# Patient Record
Sex: Female | Born: 1985
Health system: Southern US, Community
[De-identification: ages and names within clinical notes are randomized; demographics above are authoritative.]

## PROBLEM LIST (undated history)

## (undated) DIAGNOSIS — G43909 Migraine, unspecified, not intractable, without status migrainosus: Secondary | ICD-10-CM

---

## 2009-12-20 ENCOUNTER — Emergency Department (HOSPITAL_BASED_OUTPATIENT_CLINIC_OR_DEPARTMENT_OTHER): Admission: EM | Admit: 2009-12-20 | Discharge: 2009-12-20 | Payer: Self-pay | Admitting: Emergency Medicine

## 2009-12-20 ENCOUNTER — Ambulatory Visit: Payer: Self-pay | Admitting: Diagnostic Radiology

## 2010-02-18 ENCOUNTER — Inpatient Hospital Stay (HOSPITAL_COMMUNITY): Admission: AD | Admit: 2010-02-18 | Discharge: 2010-02-18 | Payer: Self-pay | Admitting: Obstetrics & Gynecology

## 2010-07-04 ENCOUNTER — Inpatient Hospital Stay (HOSPITAL_COMMUNITY)
Admission: AD | Admit: 2010-07-04 | Discharge: 2010-07-06 | Payer: Self-pay | Source: Home / Self Care | Attending: Obstetrics and Gynecology | Admitting: Obstetrics and Gynecology

## 2010-08-30 ENCOUNTER — Inpatient Hospital Stay (HOSPITAL_COMMUNITY)
Admission: AD | Admit: 2010-08-30 | Discharge: 2010-09-03 | DRG: 371 | Disposition: A | Discharge status: Home / Self Care | Payer: BC Managed Care – PPO | Source: Ambulatory Visit | Attending: Obstetrics and Gynecology | Admitting: Obstetrics and Gynecology

## 2010-08-30 ENCOUNTER — Encounter (HOSPITAL_COMMUNITY): Payer: Self-pay | Admitting: *Deleted

## 2010-08-30 DIAGNOSIS — O480 Post-term pregnancy: Principal | ICD-10-CM | POA: Diagnosis present

## 2010-08-30 LAB — CBC
MCHC: 32.8 g/dL (ref 30.0–36.0)
Platelets: 206 10*3/uL (ref 150–400)
RBC: 4.17 MIL/uL (ref 3.87–5.11)
WBC: 7.2 10*3/uL (ref 4.0–10.5)

## 2010-09-02 LAB — CBC
MCHC: 31.5 g/dL (ref 30.0–36.0)
MCV: 90.7 fL (ref 78.0–100.0)
Platelets: 172 10*3/uL (ref 150–400)
RBC: 3.33 MIL/uL — ABNORMAL LOW (ref 3.87–5.11)
RDW: 20 % — ABNORMAL HIGH (ref 11.5–15.5)
WBC: 9.5 10*3/uL (ref 4.0–10.5)

## 2010-09-04 ENCOUNTER — Observation Stay (HOSPITAL_COMMUNITY)
Admission: AD | Admit: 2010-09-04 | Discharge: 2010-09-05 | Disposition: A | Discharge status: Home / Self Care | Payer: Medicaid Other | Source: Ambulatory Visit | Attending: Obstetrics and Gynecology | Admitting: Obstetrics and Gynecology

## 2010-09-04 DIAGNOSIS — O9279 Other disorders of lactation: Principal | ICD-10-CM | POA: Insufficient documentation

## 2010-09-04 DIAGNOSIS — O864 Pyrexia of unknown origin following delivery: Secondary | ICD-10-CM | POA: Insufficient documentation

## 2010-09-04 LAB — COMPREHENSIVE METABOLIC PANEL
ALT: 27 U/L (ref 0–35)
AST: 31 U/L (ref 0–37)
Albumin: 2.3 g/dL — ABNORMAL LOW (ref 3.5–5.2)
Alkaline Phosphatase: 146 U/L — ABNORMAL HIGH (ref 39–117)
Calcium: 8.1 mg/dL — ABNORMAL LOW (ref 8.4–10.5)
GFR calc Af Amer: >60 mL/min (ref >60)
GFR calc non Af Amer: >60 mL/min (ref >60)
Glucose, Bld: 98 mg/dL (ref 70–99)
Total Protein: 5.8 g/dL — ABNORMAL LOW (ref 6.0–8.3)

## 2010-09-04 LAB — CBC
HCT: 33.1 % — ABNORMAL LOW (ref 36.0–46.0)
Hemoglobin: 10.7 g/dL — ABNORMAL LOW (ref 12.0–15.0)
MCH: 29 pg (ref 26.0–34.0)
MCV: 89.7 fL (ref 78.0–100.0)
Platelets: 209 10*3/uL (ref 150–400)
RBC: 3.69 MIL/uL — ABNORMAL LOW (ref 3.87–5.11)
WBC: 9 10*3/uL (ref 4.0–10.5)

## 2010-09-04 LAB — URINALYSIS, ROUTINE W REFLEX MICROSCOPIC
Bilirubin Urine: NEGATIVE
Ketones, ur: NEGATIVE mg/dL
Specific Gravity, Urine: 1.02 (ref 1.005–1.030)
Urine Glucose, Fasting: NEGATIVE mg/dL

## 2010-09-04 LAB — URINE MICROSCOPIC-ADD ON

## 2010-09-05 LAB — CBC
Hemoglobin: 9.9 g/dL — ABNORMAL LOW (ref 12.0–15.0)
MCH: 28.8 pg (ref 26.0–34.0)
MCHC: 32 g/dL (ref 30.0–36.0)
MCV: 89.8 fL (ref 78.0–100.0)
Platelets: 207 10*3/uL (ref 150–400)
WBC: 6.6 10*3/uL (ref 4.0–10.5)

## 2010-09-05 LAB — DIFFERENTIAL
Basophils Absolute: 0 10*3/uL (ref 0.0–0.1)
Monocytes Relative: 8 % (ref 3–12)
Neutro Abs: 5 10*3/uL (ref 1.7–7.7)
Neutrophils Relative %: 76 % (ref 43–77)

## 2010-09-08 NOTE — Op Note (Signed)
Andrea Richard, Andrea Richard            ACCOUNT NO.:  [id number]  MEDICAL RECORD NO.:  [id number]           PATIENT TYPE:  I  LOCATION:  9131                          FACILITY:  [facility]  PHYSICIAN:  Pieter Partridge, MD   DATE OF BIRTH:  1986-02-22  DATE OF PROCEDURE:  09/01/2010 DATE OF DISCHARGE:                              OPERATIVE REPORT   PREOPERATIVE DIAGNOSES: 1. Pregnancy at 41-2/7th weeks. 2. Failed induction.  POSTOPERATIVE DIAGNOSES: 1. Pregnancy at 41-2/7th weeks. 2. Failed induction.  PROCEDURE:  Primary low transverse cesarean section.  SURGEON:  Pieter Partridge, MD  ASSISTANT:  Technician.  ANESTHESIA:  Spinal.  FINDINGS:  Viable female infant, Apgars 8 at 1, 9 at 5, 8 pounds and 11 ounces.  Normal uterus.  Bilateral tubes and ovaries normal.  SPECIMENS:  None.  Placenta sent to Labor and Delivery.  ESTIMATED BLOOD LOSS:  800.  IV FLUIDS:  2000 mL.  URINE OUTPUT:  100 mL, clear.  COMPLICATIONS:  None. DISPOSITION:  To PACU stable.  INDICATIONS:  Andrea Richard is a 25 year old gravida 1 at 41-2/7th weeks by an estimated due date of August 23, 2010.  She was admitted for cervical ripening on August 30, 2010.  She received 2 doses of Cytotec and began having contractions that were moderate.  Approximately 12 hours after admission, Pitocin was done for another 10-12 hours.  She was contracting regularly, even having tachysystole.  Her cervix on admission was fingertip, thick, and high.  Pitocin was continued for approximately 12 hours.  At the end of that, she was tight, 1, still thick and high.  Pitocin was stopped.  The patient was allowed to eat and shower.  Cervidil was in place.  She began to continue to have contractions that were moderate by palpation.  The patient stated that they were very uncomfortable.  She received 3 doses Stadol, however, despite the intensity of the contractions, cervical change was not noted.  At the time of my evaluation,  it was 1, 50, and -2 at approximately 36 hours.  Once the Cervidil was removed, contractions spaced out significantly.  The patient was counseled on a primary low transverse cesarean section, risks, benefits, and alternatives and also was counseled on VBAC and its risks.  She desired to proceed with primary C-section.  PROCEDURE IN DETAIL:  Andrea Richard was identified in the operating room. She had IV running.  She underwent spinal anesthesia without complication.  She was placed in the dorsal supine position with a leftward tilt and prepped and draped in normal sterile fashion.  Foley to gravity was placed.  The abdomen was marked and a Pfannenstiel skin incision was made 2 cm above the symphysis pubis with the scalpel and carried down to the underlying layer of the fascia with the Bovie.  The fascia was incised with the Bovie and the incision was extended laterally with the curved Mayo scissors.  The Kocher clamps were used to grasp the upper edge of the fascia and the rectus muscles were dissected sharply off the fascia. The same was done on the inferior edge.  The muscles were then separated at the  middle digitally.  The peritoneum was then tented up with hemostats and entered sharply with the Metzenbaum scissors.  Peritoneum was stretched.  Alexis retractor was then placed in the usual fashion.  Lower uterine segment was identified.  Serosa was tented up with the Guernsey forceps and entered sharply with the Metzenbaum scissors and a transverse incision was made on the lower uterine segment of the uterus and extended with the bandage scissors.  Rupture of membranes with clear, output was well, was right at the incision had not descended into the pelvis at all.  The head was brought to the incision and delivered atraumatically.  Nose and mouth were suctioned.  No nuchal cord noted. Shoulders delivered easily.  Cord clamped x2 and cut.  Baby placed at bedside to awaiting NICU  Team.  Placenta was then delivered manually.  Uterus was cleared of all clots and debris.  Ring forceps were used to remove additional membranes on the lower uterine segment.  Hysterotomy incision was then reapproximated with 0 chromic in a running locked fashion.  A second layer of the same suture was used for imbrication.  The bladder flap was then reapproximated with 3-0 Vicryl on an SH needle.  Irrigation was done posteriorly and the uterus was returned to the abdomen and the gutters were then cleared of all clots and debris by irrigation.  Of note during the procedure, there was a moist laparotomy sponge placed in the abdomen to retract bowel and omentum and that was removed prior to return of the uterus to the abdomen.  The peritoneum and muscles were then reapproximated with a series of interrupted sutures.  The fascia was then reapproximated with 0 Vicryl on a continuous needle.  The subcutaneous base was irrigated and hemostasis was achieved with the Bovie.  Prior to closure of the fascia, the rectus muscles were identified for any bleeding and also under the fascia and everything was normal.  The skin was then reapproximated with 4-0 Vicryl on a Keith needle. Dermabond was then applied to the incision.  All instrument, sponge, and needle counts were correct x3.  Ancef 1 g was given prior to incision. SCDs were also in place.  The patient was then taken to the recovery room in stable condition.     Pieter Partridge, MD     EBV/MEDQ  D:  09/01/2010  T:  09/01/2010  Job:  [id number]  Electronically Signed by Geryl Rankins MD on 09/08/2010 08:37:41 AM

## 2010-09-08 NOTE — H&P (Signed)
  NAMEDEBORRAH, MABIN            ACCOUNT NO.:  [id number]  MEDICAL RECORD NO.:  [id number]           PATIENT TYPE:  I  LOCATION:  9131                          FACILITY:  [facility]  PHYSICIAN:  Pieter Partridge, MD   DATE OF BIRTH:  1986-06-16  DATE OF ADMISSION:  08/30/2010 DATE OF DISCHARGE:                             HISTORY & PHYSICAL   ANTICIPATED PROCEDURE:  Primary low transverse cesarean section.  HISTORY OF PRESENT ILLNESS:  Ms. Kolander is a 25 year old gravida 1, at 41-1/7 weeks admitted for postdates induction.  Her pregnancy was complicated by gastroenteritis approximately 30 weeks which required hospital admission for significant dehydration.  She received IV fluids and tocolysis for contractions.  Cervix never dilated.  Fetal status was always reassuring.  The patient was allowed to go approximately 1 week past her due date and desired induction.  She was then counseled on risks.  She came in on the evening of August 30, 2010, and Cytotec was placed.  Following morning, Pitocin was started.  Her cervix upon arrival was approximately fingertip.  Pitocin continued approximately 18 hours and at that time still tight one and about 30% in high.  Pitocin was stopped.  Cervidil was then placed.  The patient continued to contract on that and painful contractions were noted without any cervical change.  Also, in the pregnancy at the time of the admission for dehydration, the patient has severe anemia with a hemoglobin of 7.  She was placed on iron therapy and her hemoglobin came to 11 prior to admission, at the time of admission it was 12.  She was GBS negative.  ALLERGIES:  No known drug allergies.  MEDICATIONS:  Prenatal vitamins and iron.  PAST MEDICAL HISTORY:  Negative.  PAST SURGICAL HISTORY:  Negative.  OBSTETRIC HISTORY:  She is a primigravida.  GYNECOLOGICAL HISTORY:  She did have Chlamydia.  No abnormal paps.  No history of PID.  FAMILY HISTORY:   Remarkable for diabetes in her maternal and paternal grandmothers, hypertension in her maternal grandmother and paternal grandmother, and also her mom has emphysema.  Father of the baby has sickle cell trait.  SOCIAL HISTORY:  Negative for tobacco, alcohol, and drug use.  She also has a negative titer for varicella.  PHYSICAL EXAMINATION:  VITAL SIGNS:  The patient was afebrile.  Vital signs were within normal limits on admission. GENERAL:  No acute distress, alert and oriented. ABDOMEN:  Gravid, vertex.  Estimated fetal weight is 9 pounds. PELVIS:  Moderately adequate, fetal station.  Cervical exam, again fingertip thick and high.  External monitoring, baby is reactive, irregular contractions prior to arrival with Cytotec, more frequent contractions.  LABORATORY STUDIES:  Hemoglobin was 12.2.  ASSESSMENT:  Ms. Tihanna Goodson is a [age] year old gravida 1, at 42 weeks, admitted for elective induction for postdates.  PLAN:  To use Cytotec and Pitocin.  The patient is counseled on C- section.     Pieter Partridge, MD     EBV/MEDQ  D:  09/01/2010  T:  09/01/2010  Job:  [id number]  Electronically Signed by Geryl Rankins MD on 09/08/2010 08:37:51 AM

## 2010-09-26 LAB — WET PREP, GENITAL
Trich, Wet Prep: NONE SEEN
Yeast Wet Prep HPF POC: NONE SEEN

## 2010-09-26 LAB — URINALYSIS, DIPSTICK ONLY
Bilirubin Urine: NEGATIVE
Bilirubin Urine: NEGATIVE
Glucose, UA: NEGATIVE mg/dL
Glucose, UA: NEGATIVE mg/dL
Hgb urine dipstick: NEGATIVE
Ketones, ur: NEGATIVE mg/dL
Leukocytes, UA: NEGATIVE
Nitrite: NEGATIVE
Nitrite: NEGATIVE
Nitrite: NEGATIVE
Protein, ur: NEGATIVE mg/dL
Specific Gravity, Urine: 1.01 (ref 1.005–1.030)
Specific Gravity, Urine: 1.02 (ref 1.005–1.030)
Specific Gravity, Urine: 1.025 (ref 1.005–1.030)
Urobilinogen, UA: 0.2 mg/dL (ref 0.0–1.0)
Urobilinogen, UA: 1 mg/dL (ref 0.0–1.0)
pH: 6 (ref 5.0–8.0)
pH: 6.5 (ref 5.0–8.0)
pH: 7.5 (ref 5.0–8.0)

## 2010-09-26 LAB — DIFFERENTIAL
Basophils Absolute: 0 10*3/uL (ref 0.0–0.1)
Basophils Absolute: 0 10*3/uL (ref 0.0–0.1)
Basophils Absolute: 0 10*3/uL (ref 0.0–0.1)
Basophils Relative: 0 % (ref 0–1)
Basophils Relative: 0 % (ref 0–1)
Basophils Relative: 0 % (ref 0–1)
Eosinophils Absolute: 0 10*3/uL (ref 0.0–0.7)
Eosinophils Absolute: 0 10*3/uL (ref 0.0–0.7)
Eosinophils Absolute: 0.1 10*3/uL (ref 0.0–0.7)
Eosinophils Relative: 0 % (ref 0–5)
Eosinophils Relative: 0 % (ref 0–5)
Eosinophils Relative: 1 % (ref 0–5)
Lymphocytes Relative: 15 % (ref 12–46)
Lymphocytes Relative: 5 % — ABNORMAL LOW (ref 12–46)
Lymphocytes Relative: 7 % — ABNORMAL LOW (ref 12–46)
Lymphs Abs: 0.5 10*3/uL — ABNORMAL LOW (ref 0.7–4.0)
Lymphs Abs: 0.6 10*3/uL — ABNORMAL LOW (ref 0.7–4.0)
Lymphs Abs: 0.9 10*3/uL (ref 0.7–4.0)
Monocytes Absolute: 0.5 10*3/uL (ref 0.1–1.0)
Monocytes Absolute: 0.7 10*3/uL (ref 0.1–1.0)
Monocytes Absolute: 0.7 10*3/uL (ref 0.1–1.0)
Monocytes Relative: 11 % (ref 3–12)
Monocytes Relative: 7 % (ref 3–12)
Monocytes Relative: 7 % (ref 3–12)
Neutro Abs: 4.5 10*3/uL (ref 1.7–7.7)
Neutro Abs: 6.2 10*3/uL (ref 1.7–7.7)
Neutro Abs: 8.9 10*3/uL — ABNORMAL HIGH (ref 1.7–7.7)
Neutrophils Relative %: 73 % (ref 43–77)
Neutrophils Relative %: 87 % — ABNORMAL HIGH (ref 43–77)
Neutrophils Relative %: 87 % — ABNORMAL HIGH (ref 43–77)

## 2010-09-26 LAB — CBC
HCT: 23.5 % — ABNORMAL LOW (ref 36.0–46.0)
HCT: 24.4 % — ABNORMAL LOW (ref 36.0–46.0)
HCT: 30.2 % — ABNORMAL LOW (ref 36.0–46.0)
Hemoglobin: 7.6 g/dL — ABNORMAL LOW (ref 12.0–15.0)
Hemoglobin: 7.8 g/dL — ABNORMAL LOW (ref 12.0–15.0)
Hemoglobin: 9.6 g/dL — ABNORMAL LOW (ref 12.0–15.0)
MCH: 26.4 pg (ref 26.0–34.0)
MCH: 26.6 pg (ref 26.0–34.0)
MCH: 27 pg (ref 26.0–34.0)
MCHC: 31.8 g/dL (ref 30.0–36.0)
MCHC: 32 g/dL (ref 30.0–36.0)
MCHC: 32.3 g/dL (ref 30.0–36.0)
MCV: 83.2 fL (ref 78.0–100.0)
MCV: 83.3 fL (ref 78.0–100.0)
MCV: 83.6 fL (ref 78.0–100.0)
Platelets: 188 10*3/uL (ref 150–400)
Platelets: 193 10*3/uL (ref 150–400)
Platelets: 220 10*3/uL (ref 150–400)
RBC: 2.81 MIL/uL — ABNORMAL LOW (ref 3.87–5.11)
RBC: 2.93 MIL/uL — ABNORMAL LOW (ref 3.87–5.11)
RBC: 3.63 MIL/uL — ABNORMAL LOW (ref 3.87–5.11)
RDW: 13.8 % (ref 11.5–15.5)
RDW: 14 % (ref 11.5–15.5)
RDW: 14.1 % (ref 11.5–15.5)
WBC: 10.4 10*3/uL (ref 4.0–10.5)
WBC: 6.1 10*3/uL (ref 4.0–10.5)
WBC: 7.1 10*3/uL (ref 4.0–10.5)

## 2010-09-26 LAB — COMPREHENSIVE METABOLIC PANEL
ALT: 13 U/L (ref 0–35)
AST: 17 U/L (ref 0–37)
Albumin: 2.7 g/dL — ABNORMAL LOW (ref 3.5–5.2)
Alkaline Phosphatase: 154 U/L — ABNORMAL HIGH (ref 39–117)
BUN: 4 mg/dL — ABNORMAL LOW (ref 6–23)
CO2: 21 mEq/L (ref 19–32)
Calcium: 8.7 mg/dL (ref 8.4–10.5)
Chloride: 103 mEq/L (ref 96–112)
Creatinine, Ser: 0.54 mg/dL (ref 0.4–1.2)
GFR calc Af Amer: >60 mL/min (ref >60)
GFR calc non Af Amer: >60 mL/min (ref >60)
Glucose, Bld: 81 mg/dL (ref 70–99)
Potassium: 3.5 mEq/L (ref 3.5–5.1)
Sodium: 134 mEq/L — ABNORMAL LOW (ref 135–145)
Total Bilirubin: 0.6 mg/dL (ref 0.3–1.2)
Total Protein: 6.8 g/dL (ref 6.0–8.3)

## 2010-09-26 LAB — URINE MICROSCOPIC-ADD ON

## 2010-09-26 LAB — URINALYSIS, ROUTINE W REFLEX MICROSCOPIC
Bilirubin Urine: NEGATIVE
Glucose, UA: NEGATIVE mg/dL
Hgb urine dipstick: NEGATIVE
Protein, ur: NEGATIVE mg/dL
Specific Gravity, Urine: 1.025 (ref 1.005–1.030)

## 2010-09-26 LAB — KETONES, QUALITATIVE: Acetone, Bld: NEGATIVE

## 2010-09-26 LAB — AMYLASE: Amylase: 72 U/L (ref 0–105)

## 2010-09-26 LAB — LIPASE, BLOOD: Lipase: 37 U/L (ref 11–59)

## 2010-09-30 LAB — URINALYSIS, ROUTINE W REFLEX MICROSCOPIC
Bilirubin Urine: NEGATIVE
Glucose, UA: NEGATIVE mg/dL
Ketones, ur: NEGATIVE mg/dL
Leukocytes, UA: NEGATIVE
Nitrite: NEGATIVE
Protein, ur: NEGATIVE mg/dL
Specific Gravity, Urine: 1.02 (ref 1.005–1.030)
Urobilinogen, UA: 0.2 mg/dL (ref 0.0–1.0)
pH: 6.5 (ref 5.0–8.0)

## 2010-09-30 LAB — URINE MICROSCOPIC-ADD ON

## 2010-10-03 LAB — GC/CHLAMYDIA PROBE AMP, GENITAL: Chlamydia, DNA Probe: NEGATIVE

## 2010-10-03 LAB — BASIC METABOLIC PANEL
BUN: 7 mg/dL (ref 6–23)
Calcium: 9.2 mg/dL (ref 8.4–10.5)
Creatinine, Ser: 0.7 mg/dL (ref 0.4–1.2)
GFR calc non Af Amer: >60 mL/min (ref >60)
Glucose, Bld: 107 mg/dL — ABNORMAL HIGH (ref 70–99)
Potassium: 3.6 mEq/L (ref 3.5–5.1)

## 2010-10-03 LAB — URINALYSIS, ROUTINE W REFLEX MICROSCOPIC
Bilirubin Urine: NEGATIVE
Glucose, UA: NEGATIVE mg/dL
Ketones, ur: 15 mg/dL — AB
Protein, ur: NEGATIVE mg/dL

## 2010-10-03 LAB — CBC
Platelets: 268 10*3/uL (ref 150–400)
RDW: 12.4 % (ref 11.5–15.5)
WBC: 5.8 10*3/uL (ref 4.0–10.5)

## 2010-10-03 LAB — URINE CULTURE: Colony Count: 75000

## 2010-10-03 LAB — DIFFERENTIAL
Basophils Absolute: 0.1 10*3/uL (ref 0.0–0.1)
Lymphocytes Relative: 24 % (ref 12–46)
Lymphs Abs: 1.4 10*3/uL (ref 0.7–4.0)
Neutrophils Relative %: 66 % (ref 43–77)

## 2010-10-03 LAB — ABO/RH: ABO/RH(D): B POS

## 2010-10-03 LAB — WET PREP, GENITAL

## 2010-10-03 LAB — RPR: RPR Ser Ql: NONREACTIVE

## 2010-10-03 LAB — HCG, QUANTITATIVE, PREGNANCY: hCG, Beta Chain, Quant, S: 16844 m[IU]/mL — ABNORMAL HIGH (ref <5)

## 2011-05-05 ENCOUNTER — Other Ambulatory Visit (HOSPITAL_COMMUNITY)
Admission: RE | Admit: 2011-05-05 | Discharge: 2011-05-05 | Disposition: A | Discharge status: Home / Self Care | Payer: Medicaid Other | Source: Ambulatory Visit | Attending: Obstetrics and Gynecology | Admitting: Obstetrics and Gynecology

## 2011-05-05 DIAGNOSIS — N760 Acute vaginitis: Secondary | ICD-10-CM | POA: Insufficient documentation

## 2011-05-05 DIAGNOSIS — Z01419 Encounter for gynecological examination (general) (routine) without abnormal findings: Secondary | ICD-10-CM | POA: Insufficient documentation

## 2011-05-05 DIAGNOSIS — Z113 Encounter for screening for infections with a predominantly sexual mode of transmission: Secondary | ICD-10-CM | POA: Insufficient documentation

## 2011-10-14 ENCOUNTER — Emergency Department (HOSPITAL_BASED_OUTPATIENT_CLINIC_OR_DEPARTMENT_OTHER)
Admission: EM | Admit: 2011-10-14 | Discharge: 2011-10-14 | Disposition: A | Discharge status: Home / Self Care | Payer: Medicaid Other | Attending: Emergency Medicine | Admitting: Emergency Medicine

## 2011-10-14 ENCOUNTER — Encounter (HOSPITAL_BASED_OUTPATIENT_CLINIC_OR_DEPARTMENT_OTHER): Payer: Self-pay | Admitting: *Deleted

## 2011-10-14 DIAGNOSIS — H53149 Visual discomfort, unspecified: Secondary | ICD-10-CM | POA: Insufficient documentation

## 2011-10-14 DIAGNOSIS — R112 Nausea with vomiting, unspecified: Secondary | ICD-10-CM | POA: Insufficient documentation

## 2011-10-14 DIAGNOSIS — R51 Headache: Secondary | ICD-10-CM | POA: Insufficient documentation

## 2011-10-14 HISTORY — DX: Migraine, unspecified, not intractable, without status migrainosus: G43.909

## 2011-10-14 MED ORDER — SODIUM CHLORIDE 0.9 % IV BOLUS (SEPSIS): 1000.0000 mL | Freq: Once | INTRAVENOUS | Status: DC

## 2011-10-14 MED ORDER — DIPHENHYDRAMINE HCL 50 MG/ML IJ SOLN
25.0000 mg | Freq: Once | INTRAMUSCULAR | Status: AC
  Administered 2011-10-14: 25 mg via INTRAVENOUS
  Filled 2011-10-14: qty 1

## 2011-10-14 MED ORDER — KETOROLAC TROMETHAMINE 30 MG/ML IJ SOLN
30.0000 mg | Freq: Once | INTRAMUSCULAR | Status: DC
  Filled 2011-10-14: qty 1

## 2011-10-14 MED ORDER — KETOROLAC TROMETHAMINE 60 MG/2ML IM SOLN
INTRAMUSCULAR | Status: AC
  Filled 2011-10-14: qty 2

## 2011-10-14 MED ORDER — METOCLOPRAMIDE HCL 5 MG/ML IJ SOLN
10.0000 mg | Freq: Once | INTRAMUSCULAR | Status: AC
  Administered 2011-10-14: 10 mg via INTRAVENOUS
  Filled 2011-10-14: qty 2

## 2011-10-14 MED ORDER — KETOROLAC TROMETHAMINE 60 MG/2ML IM SOLN
60.0000 mg | Freq: Once | INTRAMUSCULAR | Status: AC
  Administered 2011-10-14: 60 mg via INTRAMUSCULAR

## 2011-10-14 MED ORDER — ONDANSETRON 4 MG PO TBDP: 4.0000 mg | ORAL_TABLET | Freq: Three times a day (TID) | ORAL | Status: AC | PRN

## 2011-10-14 NOTE — ED Provider Notes (Signed)
History     CSN: [id number]  Arrival date & time 10/14/11  1620   First MD Initiated Contact with Patient 10/14/11 1708      Chief Complaint  Patient presents with  . Migraine    (Consider location/radiation/quality/duration/timing/severity/associated sxs/prior treatment) HPI Comments: Pt states that she has a history of migraines and it is usually controlled with tylenol:pt states that is having light sensitivity and vomiting  Patient is a 26 y.o. female presenting with migraine. The history is provided by the patient. No language interpreter was used.  Migraine This is a recurrent problem. The current episode started today. The problem occurs constantly. The problem has been unchanged. Associated symptoms include headaches, nausea and vomiting. Pertinent negatives include no fever or rash. Exacerbated by: light. She has tried acetaminophen for the symptoms. The treatment provided no relief.    Past Medical History  Diagnosis Date  . Migraines     Past Surgical History  Procedure Date  . Cesarean section     History reviewed. No pertinent family history.  History  Substance Use Topics  . Smoking status: Never Smoker   . Smokeless tobacco: Not on file  . Alcohol Use: No    OB History    Grav Para Term Preterm Abortions TAB SAB Ect Mult Living   1               Review of Systems  Constitutional: Negative for fever.  Eyes: Positive for photophobia.  Respiratory: Negative.   Cardiovascular: Negative.   Gastrointestinal: Positive for nausea and vomiting.  Musculoskeletal: Negative.   Skin: Negative for rash.  Neurological: Positive for headaches.    Allergies  Review of patient's allergies indicates no known allergies.  Home Medications   Current Outpatient Rx  Name Route Sig Dispense Refill  . ASPIRIN-ACETAMINOPHEN-CAFFEINE 250-250-65 MG PO TABS Oral Take 1 tablet by mouth every 6 (six) hours as needed. For pain      BP 117/95  Pulse 104  Temp(Src)  97.9 F (36.6 C) (Oral)  Resp 22  Ht 5\' 2"  (1.575 m)  Wt 166 lb (75.297 kg)  BMI 30.36 kg/m2  SpO2 98%  LMP 10/12/2011  Breastfeeding? Unknown  Physical Exam  Nursing note and vitals reviewed. Constitutional: She is oriented to person, place, and time. She appears well-developed and well-nourished.  HENT:  Head: Atraumatic.  Right Ear: External ear normal.  Left Ear: External ear normal.  Nose: Nose normal.  Mouth/Throat: Oropharynx is clear and moist.  Eyes: Conjunctivae and EOM are normal. Pupils are equal, round, and reactive to light.  Neck: Normal range of motion. Neck supple.  Cardiovascular: Normal rate and regular rhythm.   Pulmonary/Chest: Effort normal and breath sounds normal.  Musculoskeletal: Normal range of motion.  Neurological: She is alert and oriented to person, place, and time. She exhibits normal muscle tone. Coordination normal.  Skin: Skin is warm and dry.  Psychiatric: She has a normal mood and affect.    ED Course  Procedures (including critical care time)   Labs Reviewed  PREGNANCY, URINE   No results found.   1. Headache       MDM  Pt feeling better at this time after migraine cocktail:pt is tolerating po and is ready to go home:headache similar to previous headaches        Teressa Lower, NP 10/14/11 1926

## 2011-10-14 NOTE — ED Notes (Signed)
The patient is undressed and in a gown. The bed is locked and in the lowest position. The call light is within reach. Her family is in the room with her. The light in the room is out do to the migraine she is having, and that her eyes are hurting.

## 2011-10-14 NOTE — ED Notes (Signed)
Pt states she has a hx of migraines and can normally control the pain with Tylenol. Today has had vomiting and visual disturbances. No relief with Tylenol. PERL.

## 2011-10-14 NOTE — Discharge Instructions (Signed)

## 2011-10-15 NOTE — ED Provider Notes (Signed)
Medical screening examination/treatment/procedure(s) were performed by non-physician practitioner and as supervising physician I was immediately available for consultation/collaboration.  Doug Sou, MD 10/15/11 [phone]

## 2011-11-23 ENCOUNTER — Other Ambulatory Visit (HOSPITAL_COMMUNITY)
Admission: RE | Admit: 2011-11-23 | Discharge: 2011-11-23 | Disposition: A | Discharge status: Home / Self Care | Payer: BC Managed Care – PPO | Source: Ambulatory Visit | Attending: Obstetrics and Gynecology | Admitting: Obstetrics and Gynecology

## 2011-11-23 DIAGNOSIS — Z01419 Encounter for gynecological examination (general) (routine) without abnormal findings: Secondary | ICD-10-CM | POA: Insufficient documentation

## 2011-11-23 DIAGNOSIS — N760 Acute vaginitis: Secondary | ICD-10-CM | POA: Insufficient documentation

## 2012-03-13 ENCOUNTER — Encounter (HOSPITAL_BASED_OUTPATIENT_CLINIC_OR_DEPARTMENT_OTHER): Payer: Self-pay | Admitting: *Deleted

## 2012-03-13 ENCOUNTER — Emergency Department (HOSPITAL_BASED_OUTPATIENT_CLINIC_OR_DEPARTMENT_OTHER)
Admission: EM | Admit: 2012-03-13 | Discharge: 2012-03-13 | Disposition: A | Discharge status: Home / Self Care | Payer: BC Managed Care – PPO | Attending: Emergency Medicine | Admitting: Emergency Medicine

## 2012-03-13 DIAGNOSIS — L980 Pyogenic granuloma: Secondary | ICD-10-CM | POA: Insufficient documentation

## 2012-03-13 MED ORDER — LIDOCAINE-EPINEPHRINE 2 %-1:100000 IJ SOLN
INTRAMUSCULAR | Status: AC
  Administered 2012-03-13: 23:00:00
  Filled 2012-03-13: qty 1

## 2012-03-13 MED ORDER — BACITRACIN 500 UNIT/GM EX OINT
1.0000 "application " | TOPICAL_OINTMENT | Freq: Two times a day (BID) | CUTANEOUS | Status: DC
  Administered 2012-03-13: 1 via TOPICAL
  Filled 2012-03-13: qty 0.9

## 2012-03-13 NOTE — ED Provider Notes (Signed)
History     CSN: [id number]  Arrival date & time 03/13/12  2058   First MD Initiated Contact with Patient 03/13/12 2303      Chief Complaint  Patient presents with  . Abscess    (Consider location/radiation/quality/duration/timing/severity/associated sxs/prior treatment) HPI This is a 26 year old black female with about a one-week history of a lesion on the left medial thigh just distal to the groin fold. The lesion is mildly to moderately tender. It is pink and friable. It has not been draining pus. There was no known trauma that triggered it. She has no history of a similar lesion.  Past Medical History  Diagnosis Date  . Migraines     Past Surgical History  Procedure Date  . Cesarean section     History reviewed. No pertinent family history.  History  Substance Use Topics  . Smoking status: Never Smoker   . Smokeless tobacco: Not on file  . Alcohol Use: No    OB History    Grav Para Term Preterm Abortions TAB SAB Ect Mult Living   1               Review of Systems  All other systems reviewed and are negative.    Allergies  Review of patient's allergies indicates no known allergies.  Home Medications  No current outpatient prescriptions on file.  BP 128/70  Pulse 99  Temp 98.7 F (37.1 C) (Oral)  Resp 16  Ht 5\' 1"  (1.549 m)  Wt 168 lb (76.204 kg)  BMI 31.74 kg/m2  SpO2 100%  Breastfeeding? Unknown  Physical Exam General: Well-developed, well-nourished female in no acute distress; appearance consistent with age of record HENT: normocephalic, atraumatic Eyes: Normal appearance Neck: supple Heart: regular rate and rhythm Lungs: Normal respiratory effort and excursion Abdomen: soft; nondistended Extremities: No deformity; full range of motion Neurologic: Awake, alert and oriented; motor function intact in all extremities and symmetric; no facial droop Skin: Warm and dry; papular lesion left medial proximal thigh, lesion is pink and friable,  about 0.5 cm in diameter Psychiatric: Normal mood and affect    ED Course  Procedures (including critical care time)  Excision  Performed by: Ananya Mccleese L Authorized by: Hanley Seamen Consent: Verbal consent obtained. Risks and benefits: risks, benefits and alternatives were discussed Consent given by: patient Patient identity confirmed: provided demographic data Prepped and Draped in normal sterile fashion  Lesion Location: Left medial proximal thigh  A 1.5 cm elliptical excision was made to remove the lesion. The lesion was immediately sent to the lab and placed in formalin. It was marked with the patient's identification.  Anesthesia: local infiltration  Local anesthetic: lidocaine 2% with epinephrine  Anesthetic total: 3 ml  Skin closure: 5-0 Prolene   Number of sutures: 3   Technique: Simple interrupted   Patient tolerance: Patient tolerated the procedure well with no immediate complications.    MDM          Hanley Seamen, MD 03/13/12 [phone]

## 2012-03-13 NOTE — ED Notes (Signed)
C/o boil to left inner thigh x 2 weeks with drainage

## 2013-01-31 ENCOUNTER — Other Ambulatory Visit: Payer: Self-pay | Admitting: Obstetrics and Gynecology

## 2013-01-31 ENCOUNTER — Other Ambulatory Visit (HOSPITAL_COMMUNITY)
Admission: RE | Admit: 2013-01-31 | Discharge: 2013-01-31 | Disposition: A | Discharge status: Home / Self Care | Payer: 59 | Source: Ambulatory Visit | Attending: Obstetrics and Gynecology | Admitting: Obstetrics and Gynecology

## 2013-01-31 DIAGNOSIS — Z01419 Encounter for gynecological examination (general) (routine) without abnormal findings: Secondary | ICD-10-CM | POA: Insufficient documentation

## 2014-02-13 DIAGNOSIS — M79609 Pain in unspecified limb: Secondary | ICD-10-CM

## 2014-05-18 ENCOUNTER — Encounter (HOSPITAL_BASED_OUTPATIENT_CLINIC_OR_DEPARTMENT_OTHER): Payer: Self-pay | Admitting: *Deleted

## 2014-07-16 ENCOUNTER — Encounter: Payer: Self-pay | Admitting: Podiatry

## 2014-10-23 DIAGNOSIS — R52 Pain, unspecified: Secondary | ICD-10-CM

## 2014-11-11 DIAGNOSIS — R52 Pain, unspecified: Secondary | ICD-10-CM

## 2014-11-17 ENCOUNTER — Ambulatory Visit (INDEPENDENT_AMBULATORY_CARE_PROVIDER_SITE_OTHER): Payer: 59

## 2014-11-17 ENCOUNTER — Ambulatory Visit (INDEPENDENT_AMBULATORY_CARE_PROVIDER_SITE_OTHER): Payer: 59 | Admitting: Podiatry

## 2014-11-17 VITALS — BP 102/74 | HR 64 | Resp 16

## 2014-11-17 DIAGNOSIS — M779 Enthesopathy, unspecified: Secondary | ICD-10-CM

## 2014-11-17 MED ORDER — MELOXICAM 7.5 MG PO TABS: 7.5000 mg | ORAL_TABLET | Freq: Every day | ORAL | Status: DC

## 2014-11-17 NOTE — Patient Instructions (Signed)
Peroneal Tendinitis with Rehab Tendonitis is inflammation of a tendon. Inflammation of the tendons on the back of the outer ankle (peroneal tendons) is known as peroneal tendonitis. The peroneal tendons are responsible for connecting the muscles that allow you to stand on your tiptoes to the bones of the ankle. For this reason, peroneal tendonitis often causes pain when trying to complete such motions. Peroneal tendonitis often involves a tear (strain) of the peroneal tendons. Strains are classified into three categories. Grade 1 strains cause pain, but the tendon is not lengthened. Grade 2 strains include a lengthened ligament, due to the ligament being stretched or partially ruptured. With grade 2 strains there is still function, although function may be decreased. Grade 3 strains involve a complete tear of the tendon or muscle, and function is usually impaired. SYMPTOMS   Pain, tenderness, swelling, warmth, or redness over the back of the outer side of the ankle, the outer part of the mid-foot, or the bottom of the arch.  Pain that gets worse with ankle motion (especially when pushing off or pushing down with the front of the foot), or when standing on the ball of the foot or pushing the foot outward.  Crackling sound (crepitation) when the tendon is moved or touched. CAUSES  Peroneal tendinitis occurs when injury to the peroneal tendons causes the body to respond with inflammation. Common causes of injury include:  An overuse injury, in which the groove behind the outer ankle (where the tendon is located) causes wear on the tendon.  A sudden stress placed on the tendon, such as from an increase in the intensity, frequency, or duration of training.  Direct hit (trauma) to the tendon.  Return to activity too soon after a previous ankle injury. RISK INCREASES WITH:  Sports that require sudden, repetitive pushing off of the foot, such as jumping or quick starts.  Kicking and running sports,  especially running down hills or long distances.  Poor strength and flexibility.  Previous injury to the foot, ankle, or leg. PREVENTION  Warm up and stretch properly before activity.  Allow for adequate recovery between workouts.  Maintain physical fitness:  Strength, flexibility, and endurance.  Cardiovascular fitness.  Complete rehabilitation after previous injury. PROGNOSIS  If treated properly, peroneal tendonitis usually heals within 6 weeks.  RELATED COMPLICATIONS  Longer healing time, if not properly treated or if not given enough time to heal.  Recurring symptoms if activity is resumed too soon, with overuse, or when using poor technique.  If untreated, tendinitis may result in tendon rupture, requiring surgery. TREATMENT  Treatment first involves the use of ice and medicine to reduce pain and inflammation. The use of strengthening and stretching exercises may help reduce pain with activity. These exercises may be performed at home or with a therapist. Sometimes, the foot and ankle will be restrained for 10 to 14 days to promote healing. Your caregiver may advise that you place a heel lift in your shoes to reduce the stress placed on the tendon. If nonsurgical treatment is unsuccessful, surgery to remove the inflamed tendon lining (sheath) may be advised.  MEDICATION   If pain medicine is needed, nonsteroidal anti-inflammatory medicines (aspirin and ibuprofen), or other minor pain relievers (acetaminophen), are often advised.  Do not take pain medicine for 7 days before surgery.  Prescription pain relievers may be given, if your caregiver thinks they are needed. Use only as directed and only as much as you need. HEAT AND COLD  Cold treatment (icing) should   be applied for 10 to 15 minutes every 2 to 3 hours for inflammation and pain, and immediately after activity that aggravates your symptoms. Use ice packs or an ice massage.  Heat treatment may be used before  performing stretching and strengthening activities prescribed by your caregiver, physical therapist, or athletic trainer. Use a heat pack or a warm water soak. SEEK MEDICAL CARE IF:  Symptoms get worse or do not improve in 2 to 4 weeks, despite treatment.  New, unexplained symptoms develop. (Drugs used in treatment may produce side effects.) EXERCISES RANGE OF MOTION (ROM) AND STRETCHING EXERCISES - Peroneal Tendinitis These exercises may help you when beginning to rehabilitate your injury. Your symptoms may resolve with or without further involvement from your physician, physical therapist or athletic trainer. While completing these exercises, remember:   Restoring tissue flexibility helps normal motion to return to the joints. This allows healthier, less painful movement and activity.  An effective stretch should be held for at least 30 seconds.  A stretch should never be painful. You should only feel a gentle lengthening or release in the stretched tissue. RANGE OF MOTION - Ankle Eversion  Sit with your right / left ankle crossed over your opposite knee.  Grip your foot with your opposite hand, placing your thumb on the top of your foot and your fingers across the bottom of your foot.  Gently push your foot downward with a slight rotation, so your littlest toes rise slightly toward the ceiling.  You should feel a gentle stretch on the inside of your ankle. Hold the stretch for __________ seconds. Repeat __________ times. Complete this exercise __________ times per day.  RANGE OF MOTION - Ankle Inversion  Sit with your right / left ankle crossed over your opposite knee.  Grip your foot with your opposite hand, placing your thumb on the bottom of your foot and your fingers across the top of your foot.  Gently pull your foot so the smallest toe comes toward you and your thumb pushes the inside of the ball of your foot away from you.  You should feel a gentle stretch on the outside of  your ankle. Hold the stretch for __________ seconds. Repeat __________ times. Complete this exercise __________ times per day.  RANGE OF MOTION - Ankle Plantar Flexion  Sit with your right / left leg crossed over your opposite knee.  Use your opposite hand to pull the top of your foot and toes toward you.  You should feel a gentle stretch on the top of your foot and ankle. Hold this position for __________ seconds. Repeat __________ times. Complete __________ times per day.  STRETCH - Gastroc, Standing  Place your hands on a wall.  Extend your right / left leg behind you, keeping the front knee somewhat bent.  Slightly point your toes inward on your back foot.  Keeping your right / left heel on the floor and your knee straight, shift your weight toward the wall, not allowing your back to arch.  You should feel a gentle stretch in the calf. Hold this position for __________ seconds. Repeat __________ times. Complete this stretch __________ times per day. STRETCH - Soleus, Standing  Place your hands on a wall.  Extend your right / left leg behind you, keeping the other knee somewhat bent.  Slightly point your toes inward on your back foot.  Keep your heel on the floor, bend your back knee, and slightly shift your weight over the back leg so that   you feel a gentle stretch deep in your back calf.  Hold this position for __________ seconds. Repeat __________ times. Complete this stretch __________ times per day. STRETCH - Gastrocsoleus, Standing Note: This exercise can place a lot of stress on your foot and ankle. Please complete this exercise only if specifically instructed by your caregiver.   Place the ball of your right / left foot on a step, keeping your other foot firmly on the same step.  Hold on to the wall or a rail for balance.  Slowly lift your other foot, allowing your body weight to press your heel down over the edge of the step.  You should feel a stretch in your  right / left calf.  Hold this position for __________ seconds.  Repeat this exercise with a slight bend in your knee. Repeat __________ times. Complete this stretch __________ times per day.  STRENGTHENING EXERCISES - Peroneal Tendinitis  These exercises may help you when beginning to rehabilitate your injury. They may resolve your symptoms with or without further involvement from your physician, physical therapist or athletic trainer. While completing these exercises, remember:   Muscles can gain both the endurance and the strength needed for everyday activities through controlled exercises.  Complete these exercises as instructed by your physician, physical therapist or athletic trainer. Increase the resistance and repetitions only as guided by your caregiver. STRENGTH - Dorsiflexors  Secure a rubber exercise band or tubing to a fixed object (table, pole) and loop the other end around your right / left foot.  Sit on the floor facing the fixed object. The band should be slightly tense when your foot is relaxed.  Slowly draw your foot back toward you, using your ankle and toes.  Hold this position for __________ seconds. Slowly release the tension in the band and return your foot to the starting position. Repeat __________ times. Complete this exercise __________ times per day.  STRENGTH - Towel Curls  Sit in a chair, on a non-carpeted surface.  Place your foot on a towel, keeping your heel on the floor.  Pull the towel toward your heel only by curling your toes. Keep your heel on the floor.  If instructed by your physician, physical therapist or athletic trainer, add weight to the end of the towel. Repeat __________ times. Complete this exercise __________ times per day. STRENGTH - Ankle Eversion   Secure one end of a rubber exercise band or tubing to a fixed object (table, pole). Loop the other end around your foot, just before your toes.  Place your fists between your knees.  This will focus your strengthening at your ankle.  Drawing the band across your opposite foot, away from the pole, slowly, pull your little toe out and up. Make sure the band is positioned to resist the entire motion.  Hold this position for __________ seconds.  Have your muscles resist the band, as it slowly pulls your foot back to the starting position. Repeat __________ times. Complete this exercise __________ times per day.  Document Released: 07/03/2005 Document Revised: 11/17/2013 Document Reviewed: 10/15/2008 [employer] Patient Information 2015 [employer]. This information is not intended to replace advice given to you by your health care provider. Make sure you discuss any questions you have with your health care provider.  

## 2014-11-17 NOTE — Progress Notes (Signed)
Subjective:     Patient ID: Andrea Richard, female   DOB: May 20, 1986, 29 y.o.   MRN: [id number]  HPI Kelton PillarCharity presents to the office today with complaints of right ankle pain which has been ongoing for the last several months. She states the that the pain started when she started to walk more for exercise. She denies any history of injury or trauma. Denies any numbness or tingling. Denies any swelling or redness over the area. She has had no prior treatment. No other complaints at this time.   Review of Systems  All other systems reviewed and are negative.      Objective:   Physical Exam AAO x3, NAD DP/PT pulses palpable bilaterally, CRT less than 3 seconds Protective sensation intact with Simms Weinstein monofilament, vibratory sensation intact, Achilles tendon reflex intact Currently there is no areas of tenderness to palpation along the course of the peroneal tendons however subjectively I palpate over the plantar tendons posterior it in [place] lateral malleolus this is where she states that she has majority for pain. There is no pinpoint bony tenderness or pain the vibratory sensation to the tibia, fibula, areas of the foot. There is no pain on the lateral ankle ligaments, medial ankle ligaments, syndesmosis. Ankle joint range of motion is intact and pain-free without any restrictions. No subtalar joint pain with range of motion. Decrease in medial arch height upon weightbearing with mild equinus present. No other areas of tenderness to bilateral lower extremities. MMT 5/5, ROM WNL.  No open lesions or pre-ulcerative lesions.  No overlying edema, erythema, increase in warmth to bilateral lower extremities.  No pain with calf compression, swelling, warmth, erythema bilaterally.      Assessment:     29 year old female with likely right heel tendinitis    Plan:     -X-rays were obtained and reviewed the patient. -Treatment options were discussed include alternatives, risks,  complications. -Discussed likely etiology of her symptoms. -Discussed stretching activities to help reapposed the peroneal tendons. These were dispensed to her today. - Prescribed meloxicam to take as needed. Monitor for any signs of infection and directed to call the office immediately should any occur. -Dispensed power steps to help support her foot type. The appropriate break in period was discussed the patient. -Follow-up as needed. Call the with any questions, concerns, changes symptoms.

## 2014-11-18 ENCOUNTER — Ambulatory Visit: Payer: Self-pay | Admitting: Podiatrist

## 2015-03-22 ENCOUNTER — Encounter (HOSPITAL_BASED_OUTPATIENT_CLINIC_OR_DEPARTMENT_OTHER): Payer: Self-pay | Admitting: Emergency Medicine

## 2015-03-22 ENCOUNTER — Emergency Department (HOSPITAL_BASED_OUTPATIENT_CLINIC_OR_DEPARTMENT_OTHER)
Admission: EM | Admit: 2015-03-22 | Discharge: 2015-03-22 | Disposition: A | Discharge status: Home / Self Care | Payer: 59 | Attending: Emergency Medicine | Admitting: Emergency Medicine

## 2015-03-22 ENCOUNTER — Emergency Department (HOSPITAL_BASED_OUTPATIENT_CLINIC_OR_DEPARTMENT_OTHER): Payer: 59

## 2015-03-22 DIAGNOSIS — R0789 Other chest pain: Secondary | ICD-10-CM | POA: Insufficient documentation

## 2015-03-22 DIAGNOSIS — R197 Diarrhea, unspecified: Secondary | ICD-10-CM | POA: Insufficient documentation

## 2015-03-22 DIAGNOSIS — R112 Nausea with vomiting, unspecified: Secondary | ICD-10-CM | POA: Insufficient documentation

## 2015-03-22 DIAGNOSIS — Z3202 Encounter for pregnancy test, result negative: Secondary | ICD-10-CM | POA: Diagnosis not present

## 2015-03-22 DIAGNOSIS — Z8679 Personal history of other diseases of the circulatory system: Secondary | ICD-10-CM | POA: Diagnosis not present

## 2015-03-22 DIAGNOSIS — R079 Chest pain, unspecified: Secondary | ICD-10-CM | POA: Diagnosis present

## 2015-03-22 DIAGNOSIS — Z791 Long term (current) use of non-steroidal anti-inflammatories (NSAID): Secondary | ICD-10-CM | POA: Diagnosis not present

## 2015-03-22 LAB — BASIC METABOLIC PANEL
Anion gap: 8 (ref 5–15)
BUN: 5 mg/dL — AB (ref 6–20)
CHLORIDE: 105 mmol/L (ref 101–111)
CO2: 25 mmol/L (ref 22–32)
Calcium: 9.1 mg/dL (ref 8.9–10.3)
Creatinine, Ser: 0.81 mg/dL (ref 0.44–1.00)
GFR calc Af Amer: >60 mL/min (ref >60)
GFR calc non Af Amer: >60 mL/min (ref >60)
GLUCOSE: 140 mg/dL — AB (ref 65–99)
POTASSIUM: 3.7 mmol/L (ref 3.5–5.1)
Sodium: 138 mmol/L (ref 135–145)

## 2015-03-22 LAB — PREGNANCY, URINE: Preg Test, Ur: NEGATIVE

## 2015-03-22 LAB — URINALYSIS, ROUTINE W REFLEX MICROSCOPIC
Bilirubin Urine: NEGATIVE
Glucose, UA: NEGATIVE mg/dL
Ketones, ur: NEGATIVE mg/dL
LEUKOCYTES UA: NEGATIVE
NITRITE: NEGATIVE
PROTEIN: NEGATIVE mg/dL
SPECIFIC GRAVITY, URINE: 1.024 (ref 1.005–1.030)
UROBILINOGEN UA: 1 mg/dL (ref 0.0–1.0)
pH: 8.5 — ABNORMAL HIGH (ref 5.0–8.0)

## 2015-03-22 LAB — CBC WITH DIFFERENTIAL/PLATELET
Basophils Absolute: 0 10*3/uL (ref 0.0–0.1)
Basophils Relative: 0 % (ref 0–1)
EOS PCT: 0 % (ref 0–5)
Eosinophils Absolute: 0 10*3/uL (ref 0.0–0.7)
HEMATOCRIT: 40.3 % (ref 36.0–46.0)
HEMOGLOBIN: 12.9 g/dL (ref 12.0–15.0)
LYMPHS ABS: 0.7 10*3/uL (ref 0.7–4.0)
LYMPHS PCT: 10 % — AB (ref 12–46)
MCH: 27.9 pg (ref 26.0–34.0)
MCHC: 32 g/dL (ref 30.0–36.0)
MCV: 87 fL (ref 78.0–100.0)
Monocytes Absolute: 0.2 10*3/uL (ref 0.1–1.0)
Monocytes Relative: 4 % (ref 3–12)
NEUTROS ABS: 5.7 10*3/uL (ref 1.7–7.7)
Neutrophils Relative %: 86 % — ABNORMAL HIGH (ref 43–77)
PLATELETS: 373 10*3/uL (ref 150–400)
RBC: 4.63 MIL/uL (ref 3.87–5.11)
RDW: 13.4 % (ref 11.5–15.5)
WBC: 6.6 10*3/uL (ref 4.0–10.5)

## 2015-03-22 LAB — URINE MICROSCOPIC-ADD ON

## 2015-03-22 LAB — TROPONIN I: Troponin I: <0.03 ng/mL (ref <0.031)

## 2015-03-22 MED ORDER — KETOROLAC TROMETHAMINE 15 MG/ML IJ SOLN
15.0000 mg | Freq: Once | INTRAMUSCULAR | Status: AC
  Administered 2015-03-22: 15 mg via INTRAVENOUS
  Filled 2015-03-22: qty 1

## 2015-03-22 MED ORDER — ONDANSETRON 8 MG PO TBDP: 8.0000 mg | ORAL_TABLET | Freq: Three times a day (TID) | ORAL | Status: AC | PRN

## 2015-03-22 MED ORDER — ONDANSETRON HCL 4 MG/2ML IJ SOLN
4.0000 mg | Freq: Once | INTRAMUSCULAR | Status: AC
  Administered 2015-03-22: 4 mg via INTRAVENOUS
  Filled 2015-03-22: qty 2

## 2015-03-22 MED ORDER — PANTOPRAZOLE SODIUM 40 MG IV SOLR
40.0000 mg | Freq: Once | INTRAVENOUS | Status: AC
  Administered 2015-03-22: 40 mg via INTRAVENOUS
  Filled 2015-03-22: qty 40

## 2015-03-22 MED ORDER — SODIUM CHLORIDE 0.9 % IV BOLUS (SEPSIS)
1000.0000 mL | Freq: Once | INTRAVENOUS | Status: AC
  Administered 2015-03-22: 1000 mL via INTRAVENOUS

## 2015-03-22 NOTE — ED Notes (Signed)
C/o chest pain beginning yesterday morning. States nothing seemed to precipitate the pain, nothing makes it better or worse. C/o generalized weakness, numbness in hands and face, nausea and vomiting. Reports approximately 6 episodes of emesis. Denies LOC, shortness of breath or other symptoms. States she is on her menstrual cycle and has taken Mydol twice, hydrocodone at 0000, Excedrine 1600 and Tylenol at 0400. Appears anxious but denies feeling anxious.

## 2015-03-22 NOTE — ED Notes (Signed)
Fluids given for fluid challenge. Patient denies pain or nausea at this time.

## 2015-03-22 NOTE — ED Provider Notes (Signed)
CSN: [id number]     Arrival date & time 03/22/15  0515 History   First MD Initiated Contact with Patient 03/22/15 0535     Chief Complaint  Patient presents with  . Chest Pain     (Consider location/radiation/quality/duration/timing/severity/associated sxs/prior Treatment) HPI  This is a 29 year old female with a history of migraines. She is here with chest pain which began yesterday morning. The pain is located in her right upper chest. She describes it as dull. It does not change with deep breathing, palpation, movement or exertion. It has been severe enough to have her in tears at times. She has taken Midol twice (she is on her menses), Excedrin at 4 PM, hydrocodone at midnight and Tylenol at 4 AM. She got very anxious prior to arrival with rapid breathing and numbness in her hands and face. She's had nausea, vomiting and diarrhea associated with this, but the pain came first. She has vomited about 6 times. She denies abdominal pain except suprapubic cramping associated with her menses.  Past Medical History  Diagnosis Date  . Migraines    Past Surgical History  Procedure Laterality Date  . Cesarean section     History reviewed. No pertinent family history. Social History  Substance Use Topics  . Smoking status: Never Smoker   . Smokeless tobacco: None  . Alcohol Use: No   OB History    Gravida Para Term Preterm AB TAB SAB Ectopic Multiple Living   1              Review of Systems  All other systems reviewed and are negative.   Allergies  Review of patient's allergies indicates no known allergies.  Home Medications   Prior to Admission medications   Medication Sig Start Date End Date Taking? Authorizing Provider  meloxicam (MOBIC) 7.5 MG tablet Take 1 tablet (7.5 mg total) by mouth daily. 11/17/14   Vivi Barrack, DPM   BP 113/72 mmHg  Pulse 63  Temp(Src) 99.3 F (37.4 C) (Oral)  Resp 19  Ht  (1.575 m)  Wt 190 lb (86.183 kg)  BMI 34.74 kg/m2  SpO2 100%    Physical Exam  General: Well-developed, well-nourished female in no acute distress; appearance consistent with age of record HENT: normocephalic; atraumatic Eyes: pupils equal, round and reactive to light; extraocular muscles intact Neck: supple Heart: regular rate and rhythm; no murmurs, rubs or gallops Lungs: clear to auscultation bilaterally Chest: Nontender Abdomen: soft; nondistended; mild suprapubic tenderness; no masses or hepatosplenomegaly; bowel sounds present Extremities: No deformity; full range of motion; pulses normal Neurologic: Awake, alert and oriented; motor function intact in all extremities and symmetric; no facial droop Skin: Warm and dry Psychiatric: Appears anxious    ED Course  Procedures (including critical care time)   MDM  Nursing notes and vitals signs, including pulse oximetry, reviewed.  Summary of this visit's results, reviewed by myself:   EKG Interpretation  Date/Time:  Monday March 22 2015 05:46:51 EDT Ventricular Rate:  65 PR Interval:  120 QRS Duration: 74 QT Interval:  420 QTC Calculation: 436 R Axis:   55 Text Interpretation:  Normal sinus rhythm Artifact No old tracing to compare Confirmed by Glenroy Crossen  MD, Jonny Ruiz ([id number]) on 03/22/2015 5:54:29 AM      Labs:  Results for orders placed or performed during the hospital encounter of 03/22/15 (from the past 24 hour(s))  CBC with Differential/Platelet     Status: Abnormal   Collection Time: 03/22/15  5:55 AM  Result Value Ref Range   WBC 6.6 4.0 - 10.5 K/uL   RBC 4.63 3.87 - 5.11 MIL/uL   Hemoglobin 12.9 12.0 - 15.0 g/dL   HCT 69.6 29.5 - 28.4 %   MCV 87.0 78.0 - 100.0 fL   MCH 27.9 26.0 - 34.0 pg   MCHC 32.0 30.0 - 36.0 g/dL   RDW 13.2 44.0 - 10.2 %   Platelets 373 150 - 400 K/uL   Neutrophils Relative % 86 (H) 43 - 77 %   Neutro Abs 5.7 1.7 - 7.7 K/uL   Lymphocytes Relative 10 (L) 12 - 46 %   Lymphs Abs 0.7 0.7 - 4.0 K/uL   Monocytes Relative 4 3 - 12 %   Monocytes Absolute  0.2 0.1 - 1.0 K/uL   Eosinophils Relative 0 0 - 5 %   Eosinophils Absolute 0.0 0.0 - 0.7 K/uL   Basophils Relative 0 0 - 1 %   Basophils Absolute 0.0 0.0 - 0.1 K/uL  Basic metabolic panel     Status: Abnormal   Collection Time: 03/22/15  5:55 AM  Result Value Ref Range   Sodium 138 135 - 145 mmol/L   Potassium 3.7 3.5 - 5.1 mmol/L   Chloride 105 101 - 111 mmol/L   CO2 25 22 - 32 mmol/L   Glucose, Bld 140 (H) 65 - 99 mg/dL   BUN 5 (L) 6 - 20 mg/dL   Creatinine, Ser 7.25 0.44 - 1.00 mg/dL   Calcium 9.1 8.9 - 36.6 mg/dL   GFR calc non Af Amer >60 >60 mL/min   GFR calc Af Amer >60 >60 mL/min   Anion gap 8 5 - 15  Troponin I     Status: None   Collection Time: 03/22/15  5:55 AM  Result Value Ref Range   Troponin I <0.03 <0.031 ng/mL  Pregnancy, urine     Status: None   Collection Time: 03/22/15  6:35 AM  Result Value Ref Range   Preg Test, Ur NEGATIVE NEGATIVE  Urinalysis, Routine w reflex microscopic (not at [facility])     Status: Abnormal   Collection Time: 03/22/15  6:35 AM  Result Value Ref Range   Color, Urine YELLOW YELLOW   APPearance CLEAR CLEAR   Specific Gravity, Urine 1.024 1.005 - 1.030   pH 8.5 (H) 5.0 - 8.0   Glucose, UA NEGATIVE NEGATIVE mg/dL   Hgb urine dipstick LARGE (A) NEGATIVE   Bilirubin Urine NEGATIVE NEGATIVE   Ketones, ur NEGATIVE NEGATIVE mg/dL   Protein, ur NEGATIVE NEGATIVE mg/dL   Urobilinogen, UA 1.0 0.0 - 1.0 mg/dL   Nitrite NEGATIVE NEGATIVE   Leukocytes, UA NEGATIVE NEGATIVE  Urine microscopic-add on     Status: Abnormal   Collection Time: 03/22/15  6:35 AM  Result Value Ref Range   Squamous Epithelial / LPF RARE RARE   WBC, UA 0-2 <3 WBC/hpf   RBC / HPF 11-20 <3 RBC/hpf   Bacteria, UA FEW (A) RARE    Imaging Studies: Dg Chest 2 View  03/22/2015   CLINICAL DATA:  Chest pain beginning yesterday, generalized weakness, vomiting.  EXAM: CHEST  2 VIEW  COMPARISON:  Chest radiograph July 06, 2010  FINDINGS: The cardiac silhouette is upper  limits of normal in size, mediastinal silhouette is unremarkable. Similar mild bronchitic changes without pleural effusion or focal consolidation. No pneumothorax. Large body habitus. Osseous structures are unremarkable.  IMPRESSION: Borderline cardiomegaly.  Mild bronchitic changes.   Electronically Signed   By: Pernell Dupre  Bloomer M.D.   On: 03/22/2015 06:38   7:02 AM Chest pain resolved after IV Toradol. Nausea resolved after IV Zofran and Protonix. Patient drinking fluids without emesis.    Paula Libra, MD 03/22/15 [phone]

## 2015-03-22 NOTE — ED Notes (Signed)
Fluid challenge completed with no further nausea or vomiting.

## 2015-03-22 NOTE — ED Notes (Signed)
Patient was tearful at registration, stated she had chest pain that started earlier in day.

## 2015-04-19 ENCOUNTER — Other Ambulatory Visit: Payer: Self-pay | Admitting: *Deleted

## 2015-04-19 MED ORDER — AZITHROMYCIN 250 MG PO TABS: ORAL_TABLET | ORAL | Status: AC

## 2015-04-19 MED ORDER — AZITHROMYCIN 250 MG PO TABS: ORAL_TABLET | ORAL | Status: DC

## 2015-04-22 ENCOUNTER — Encounter (HOSPITAL_BASED_OUTPATIENT_CLINIC_OR_DEPARTMENT_OTHER): Payer: Self-pay | Admitting: *Deleted

## 2015-04-22 ENCOUNTER — Emergency Department (HOSPITAL_BASED_OUTPATIENT_CLINIC_OR_DEPARTMENT_OTHER)
Admission: EM | Admit: 2015-04-22 | Discharge: 2015-04-22 | Disposition: A | Discharge status: Home / Self Care | Payer: 59 | Attending: Emergency Medicine | Admitting: Emergency Medicine

## 2015-04-22 DIAGNOSIS — R079 Chest pain, unspecified: Secondary | ICD-10-CM | POA: Insufficient documentation

## 2015-04-22 DIAGNOSIS — Z3202 Encounter for pregnancy test, result negative: Secondary | ICD-10-CM | POA: Diagnosis not present

## 2015-04-22 DIAGNOSIS — Z8679 Personal history of other diseases of the circulatory system: Secondary | ICD-10-CM | POA: Diagnosis not present

## 2015-04-22 LAB — TROPONIN I

## 2015-04-22 LAB — HCG, SERUM, QUALITATIVE: PREG SERUM: NEGATIVE

## 2015-04-22 NOTE — Discharge Instructions (Signed)
Ibuprofen 600 mg every 6 hours as needed for pain.  Follow-up with your primary Dr. if not improving in the next week.   Chest Wall Pain Chest wall pain is pain in or around the bones and muscles of your chest. Sometimes, an injury causes this pain. Sometimes, the cause may not be known. This pain may take several weeks or longer to get better. HOME CARE INSTRUCTIONS  Pay attention to any changes in your symptoms. Take these actions to help with your pain:   Rest as told by your health care provider.   Avoid activities that cause pain. These include any activities that use your chest muscles or your abdominal and side muscles to lift heavy items.   If directed, apply ice to the painful area:  Put ice in a plastic bag.  Place a towel between your skin and the bag.  Leave the ice on for 20 minutes, 2-3 times per day.  Take over-the-counter and prescription medicines only as told by your health care provider.  Do not use tobacco products, including cigarettes, chewing tobacco, and e-cigarettes. If you need help quitting, ask your health care provider.  Keep all follow-up visits as told by your health care provider. This is important. SEEK MEDICAL CARE IF:  You have a fever.  Your chest pain becomes worse.  You have new symptoms. SEEK IMMEDIATE MEDICAL CARE IF:  You have nausea or vomiting.  You feel sweaty or light-headed.  You have a cough with phlegm (sputum) or you cough up blood.  You develop shortness of breath.   This information is not intended to replace advice given to you by your health care provider. Make sure you discuss any questions you have with your health care provider.   Document Released: 07/03/2005 Document Revised: 03/24/2015 Document Reviewed: 09/28/2014 [employer] Interactive Patient Education [employer]

## 2015-04-22 NOTE — ED Provider Notes (Signed)
CSN: [id number]     Arrival date & time 04/22/15  1101 History   First MD Initiated Contact with Patient 04/22/15 1154     Chief Complaint  Patient presents with  . Chest Pain     (Consider location/radiation/quality/duration/timing/severity/associated sxs/prior Treatment) HPI Comments: Patient is a 29 year old female with history of migraines. She presents for evaluation of chest discomfort. He states this is been occurring intermittently for the past 3 months, and it seems to occur around the time of her menses. She started her menstrual period yesterday and this is when her discomfort returns. She denies any cough. She denies any shortness of breath, fevers, or abdominal pain. She does report an episode of vomiting yesterday. She denies any exertional symptoms. She has no prior cardiac history and no cardiac risk factors.  Patient is a 29 y.o. female presenting with chest pain. The history is provided by the patient.  Chest Pain Pain location:  Substernal area Pain quality: tightness   Pain radiates to:  Does not radiate Pain severity:  Moderate Onset quality:  Sudden Duration:  2 days Progression:  Unchanged Chronicity:  Recurrent Relieved by:  Nothing Worsened by:  Nothing tried   Past Medical History  Diagnosis Date  . Migraines    Past Surgical History  Procedure Laterality Date  . Cesarean section     No family history on file. Social History  Substance Use Topics  . Smoking status: Never Smoker   . Smokeless tobacco: None  . Alcohol Use: No   OB History    Gravida Para Term Preterm AB TAB SAB Ectopic Multiple Living   1              Review of Systems  Cardiovascular: Positive for chest pain.  All other systems reviewed and are negative.     Allergies  Review of patient's allergies indicates no known allergies.  Home Medications   Prior to Admission medications   Medication Sig Start Date End Date Taking? Authorizing Provider  azithromycin  (ZITHROMAX) 250 MG tablet Take as directed. 04/19/15   Vivi Barrack, DPM  ondansetron (ZOFRAN ODT) 8 MG disintegrating tablet Take 1 tablet (8 mg total) by mouth every 8 (eight) hours as needed for nausea or vomiting. 03/22/15   John Molpus, MD   BP 121/62 mmHg  Pulse 78  Temp(Src) 98.6 F (37 C) (Oral)  Resp 20  Ht  (1.575 m)  Wt 185 lb (83.915 kg)  BMI 33.83 kg/m2  SpO2 100%  LMP 04/21/2015 Physical Exam  Constitutional: She is oriented to person, place, and time. She appears well-developed and well-nourished. No distress.  HENT:  Head: Normocephalic and atraumatic.  Neck: Normal range of motion. Neck supple.  Cardiovascular: Normal rate and regular rhythm.  Exam reveals no gallop and no friction rub.   No murmur heard. Pulmonary/Chest: Effort normal and breath sounds normal. No respiratory distress. She has no wheezes. She has no rales. She exhibits no tenderness.  Abdominal: Soft. Bowel sounds are normal. She exhibits no distension. There is no tenderness.  Musculoskeletal: Normal range of motion.  Neurological: She is alert and oriented to person, place, and time.  Skin: Skin is warm and dry. She is not diaphoretic.  Nursing note and vitals reviewed.   ED Course  Procedures (including critical care time) Labs Review Labs Reviewed  TROPONIN I  HCG, SERUM, QUALITATIVE    Imaging Review No results found. I have personally reviewed and evaluated these images and lab results  as part of my medical decision-making.   EKG Interpretation   Date/Time:  Thursday April 22 2015 11:11:33 EDT Ventricular Rate:  53 PR Interval:  114 QRS Duration: 76 QT Interval:  446 QTC Calculation: 418 R Axis:   68 Text Interpretation:  Sinus bradycardia Otherwise normal ECG Confirmed by  Gabby Rackers  MD, Tyann Niehaus ([id number]) on 04/22/2015 11:57:58 AM      MDM   Final diagnoses:  None    EKG is unchanged, troponin remains negative, and pregnancy test is negative. I am uncertain as to  why her discomfort coincides with her menses, however nothing appears emergent. I've considered pulmonary embolism, however while I was examining her her heart rate was 60 and oxygen saturations were 100%. She has no risk factors for PE and I do not feel as though further workup into this is indicated. She will be discharged home with suspected musculoskeletal chest pain. She is to return if symptoms significantly worsen or change.    Geoffery Lyons, MD 04/22/15 1355

## 2015-04-22 NOTE — ED Notes (Signed)
Family at bedside. 

## 2015-04-22 NOTE — ED Notes (Signed)
Right sided chest pain with radiation into her right arm for several months. She has been evaluated for the same pain with no diagnosis.

## 2015-05-21 ENCOUNTER — Emergency Department (HOSPITAL_COMMUNITY)
Admission: EM | Admit: 2015-05-21 | Discharge: 2015-05-21 | Disposition: A | Discharge status: Home / Self Care | Payer: 59 | Attending: Emergency Medicine | Admitting: Emergency Medicine

## 2015-05-21 ENCOUNTER — Other Ambulatory Visit: Payer: Self-pay

## 2015-05-21 DIAGNOSIS — R079 Chest pain, unspecified: Secondary | ICD-10-CM | POA: Diagnosis present

## 2015-05-21 DIAGNOSIS — Z8679 Personal history of other diseases of the circulatory system: Secondary | ICD-10-CM | POA: Insufficient documentation

## 2015-05-21 DIAGNOSIS — R110 Nausea: Secondary | ICD-10-CM | POA: Diagnosis not present

## 2015-05-21 DIAGNOSIS — R0602 Shortness of breath: Secondary | ICD-10-CM | POA: Diagnosis not present

## 2015-05-21 DIAGNOSIS — R0789 Other chest pain: Secondary | ICD-10-CM | POA: Insufficient documentation

## 2015-05-21 LAB — I-STAT TROPONIN, ED: Troponin i, poc: 0 ng/mL (ref 0.00–0.08)

## 2015-05-21 MED ORDER — IBUPROFEN 800 MG PO TABS
800.0000 mg | ORAL_TABLET | Freq: Once | ORAL | Status: AC
  Administered 2015-05-21: 800 mg via ORAL
  Filled 2015-05-21: qty 1

## 2015-05-21 MED ORDER — ONDANSETRON HCL 4 MG PO TABS
4.0000 mg | ORAL_TABLET | Freq: Once | ORAL | Status: AC
  Administered 2015-05-21: 4 mg via ORAL
  Filled 2015-05-21: qty 1

## 2015-05-21 NOTE — Discharge Instructions (Signed)
If you were given medicines take as directed.  If you are on coumadin or contraceptives realize their levels and effectiveness is altered by many different medicines.  If you have any reaction (rash, tongues swelling, other) to the medicines stop taking and see a physician.    If your blood pressure was elevated in the ER make sure you follow up for management with a primary doctor or return for chest pain, shortness of breath or stroke symptoms.  Please follow up as directed and return to the ER or see a physician for new or worsening symptoms.  Thank you. Filed Vitals:   05/21/15 0914  BP: 120/72  Pulse: 60  Temp: 98.8 F (37.1 C)  TempSrc: Oral  Resp: 20  Height: 5\' 1"  (1.549 m)  Weight: 187 lb (84.823 kg)  SpO2: 100%

## 2015-05-21 NOTE — ED Notes (Signed)
Pts vital signs updated. Pt getting dressed awaiting discharge paperwork at bedside.  

## 2015-05-21 NOTE — ED Provider Notes (Signed)
CSN: [id number]     Arrival date & time 05/21/15  0857 History   First MD Initiated Contact with Patient 05/21/15 [phone]     Chief Complaint  Patient presents with  . Chest Pain     (Consider location/radiation/quality/duration/timing/severity/associated sxs/prior Treatment) Patient is a 29 y.o. female presenting with chest pain. The history is provided by the patient.  Chest Pain Pain location:  Substernal area Pain quality: aching and sharp   Pain radiates to:  R arm Pain radiates to the back: no   Pain severity:  Moderate Onset quality:  Gradual Duration:  1 day Timing:  Intermittent Progression:  Waxing and waning Chronicity:  Recurrent Worsened by:  Nothing tried Ineffective treatments:  None tried Associated symptoms: nausea    Patient presents with recurrent chest pain since yesterday. Patient seen for the same complaint last month. States that she has chest pain with onset of menses for the past 3-4 months. Pain described as sharp and achy. Located substernally that radiates down her left arm. Pain is intermittent and occurs randomly. Not associated with exercise or exertion. Has tried ibuprofen for the pain which sometimes helps. No trauma. Mild shortness of breath. Some nausea. Grandmother had heart disease, but not family history of MI. Mother had DVT related to ankle surgery, not other family history of VTE.    Past Medical History  Diagnosis Date  . Migraines    Past Surgical History  Procedure Laterality Date  . Cesarean section     No family history on file. Social History  Substance Use Topics  . Smoking status: Never Smoker   . Smokeless tobacco: Not on file  . Alcohol Use: No   OB History    Gravida Para Term Preterm AB TAB SAB Ectopic Multiple Living   1              Review of Systems  Constitutional: Negative.   HENT: Negative.   Eyes: Negative.   Respiratory: Negative.   Cardiovascular: Positive for chest pain.  Gastrointestinal: Positive for  nausea.  Endocrine: Negative.   Genitourinary: Negative.   Musculoskeletal: Negative.   Skin: Negative.   Allergic/Immunologic: Negative.   Neurological: Negative.   Hematological: Negative.   Psychiatric/Behavioral: Negative.       Allergies  Review of patient's allergies indicates no known allergies.  Home Medications   Prior to Admission medications   Medication Sig Start Date End Date Taking? Authorizing Provider  ibuprofen (ADVIL,MOTRIN) 800 MG tablet Take 800 mg by mouth at bedtime as needed for moderate pain.    Yes Historical Provider, MD  azithromycin (ZITHROMAX) 250 MG tablet Take as directed. Patient not taking: Reported on 05/21/2015 04/19/15   Vivi BarrackMatthew R Wagoner, DPM  ondansetron (ZOFRAN ODT) 8 MG disintegrating tablet Take 1 tablet (8 mg total) by mouth every 8 (eight) hours as needed for nausea or vomiting. Patient not taking: Reported on 05/21/2015 03/22/15   John Molpus, MD   BP 120/72 mmHg  Pulse 60  Temp(Src) 98.8 F (37.1 C) (Oral)  Resp 20  Ht 5\' 1"  (1.549 m)  Wt 187 lb (84.823 kg)  BMI 35.35 kg/m2  SpO2 100%  LMP 04/21/2015 Physical Exam  Constitutional: She is oriented to person, place, and time. She appears well-developed. No distress.  HENT:  Head: Normocephalic and atraumatic.  Eyes: EOM are normal. Pupils are equal, round, and reactive to light.  Neck: Normal range of motion. Neck supple.  Cardiovascular: Normal rate, regular rhythm and normal heart sounds.  No murmur heard. Pulmonary/Chest: Effort normal and breath sounds normal. She has no wheezes.  Abdominal: Soft. She exhibits no distension. There is no tenderness.  Musculoskeletal: Normal range of motion.  Neurological: She is alert and oriented to person, place, and time.  Skin: Skin is warm and dry.  Psychiatric: She has a normal mood and affect. Her behavior is normal.    ED Course  Procedures (including critical care time) Labs Review Labs Reviewed  I-STAT TROPOININ, ED   EKG  Interpretation NSR, no ischemic changes  Bedside cardiac [place] (performed by Dr Jodi Mourning) Normal EF, no pericardial effusion   MDM   Final diagnoses:  Other chest pain    10:24 AM 28yo female with no PMH presenting with recurrent atypical chest pain for the past 3-4 months associated with her menstrual cycles. Doubt cardiac etiology - no risk factors and EKG reassuring. iSTAT troponin negative. CXR negative last month - no indication to repeat today. PERC negative. Unclear why chest pain is associated with menstrual cycles - may have some component of fibrocystic breast changes. Stable for discharge home with follow up with PCP.     Ardith Dark, MD 05/21/15 1033  Blane Ohara, MD 05/21/15 [phone]

## 2015-05-21 NOTE — ED Notes (Signed)
Patient C/O mid upper chest pain that has been ongoing for several months.  States that it is worse during her menstural cycle.  States that it became worse yesterday.  She reports being seen for this previously.

## 2015-08-10 MED FILL — MINASTRIN 24 FE CHEWABLE TA: 1-20 | 28 days supply | Qty: 28 | Fill #2

## 2015-08-10 MED FILL — IBUPROFEN 800 MG TABLET: 800 | 13 days supply | Qty: 40 | Fill #2

## 2015-08-25 MED FILL — IBUPROFEN 800 MG TABLET: 800 | 13 days supply | Qty: 40 | Fill #3

## 2015-09-03 MED FILL — MINASTRIN 24 FE CHEWABLE TA: 1-20 | 28 days supply | Qty: 28 | Fill #3

## 2015-09-06 MED FILL — ONDANSETRON ODT 4 MG TABLET: 4 | 7 days supply | Qty: 21 | Fill #0

## 2015-09-13 MED FILL — IBUPROFEN 800 MG TABLET: 800 | 13 days supply | Qty: 40 | Fill #4

## 2015-10-06 DIAGNOSIS — K219 Gastro-esophageal reflux disease without esophagitis: Secondary | ICD-10-CM | POA: Diagnosis not present

## 2015-10-06 DIAGNOSIS — G894 Chronic pain syndrome: Secondary | ICD-10-CM | POA: Diagnosis not present

## 2015-10-06 MED FILL — PANTOPRAZOLE SOD DR 40 MG T: 40 | 30 days supply | Qty: 30 | Fill #0

## 2015-10-06 MED FILL — MINASTRIN 24 FE CHEWABLE TA: 1-20 | 28 days supply | Qty: 28 | Fill #4

## 2015-10-06 MED FILL — CELECOXIB 200 MG CAPSULE: 200 | 30 days supply | Qty: 30 | Fill #0

## 2015-11-02 MED FILL — MIBELAS 24 FE CHEWABLE TAB: 1-20 | 28 days supply | Qty: 28 | Fill #5

## 2015-12-02 MED FILL — NORETH-ESTRAD-FE 1-0.02(24): 1-20 | 84 days supply | Qty: 84 | Fill #6

## 2016-02-21 DIAGNOSIS — N946 Dysmenorrhea, unspecified: Secondary | ICD-10-CM | POA: Diagnosis not present

## 2016-03-21 MED FILL — NORETH-ESTRAD-FE 1-0.02(24): 1-20 | 84 days supply | Qty: 84 | Fill #7

## 2016-03-30 ENCOUNTER — Other Ambulatory Visit (HOSPITAL_COMMUNITY)
Admission: RE | Admit: 2016-03-30 | Discharge: 2016-03-30 | Disposition: A | Discharge status: Home / Self Care | Payer: 59 | Source: Ambulatory Visit | Attending: Obstetrics and Gynecology | Admitting: Obstetrics and Gynecology

## 2016-03-30 ENCOUNTER — Other Ambulatory Visit: Payer: Self-pay | Admitting: Nurse Practitioner

## 2016-03-30 DIAGNOSIS — Z01419 Encounter for gynecological examination (general) (routine) without abnormal findings: Secondary | ICD-10-CM | POA: Diagnosis not present

## 2016-03-30 DIAGNOSIS — N760 Acute vaginitis: Secondary | ICD-10-CM | POA: Diagnosis not present

## 2016-03-30 DIAGNOSIS — R7309 Other abnormal glucose: Secondary | ICD-10-CM | POA: Diagnosis not present

## 2016-03-30 MED FILL — FLUCONAZOLE 150 MG TABLET: 150 | 8 days supply | Qty: 2 | Fill #0

## 2016-04-04 LAB — CYTOLOGY - PAP

## 2016-04-21 MED FILL — NORETH-ESTRAD-FE 1-0.02(24): 1-20 | 28 days supply | Qty: 28 | Fill #8

## 2016-05-17 MED FILL — NORETH-ESTRAD-FE 1-0.02(24): 1-20 | 56 days supply | Qty: 56 | Fill #9

## 2016-07-14 MED FILL — MINASTRIN 24 FE CHEWABLE TA: 1-20 | 84 days supply | Qty: 84 | Fill #0

## 2016-07-28 DIAGNOSIS — Z113 Encounter for screening for infections with a predominantly sexual mode of transmission: Secondary | ICD-10-CM | POA: Diagnosis not present

## 2016-07-28 DIAGNOSIS — R7303 Prediabetes: Secondary | ICD-10-CM | POA: Diagnosis not present

## 2016-07-28 DIAGNOSIS — Z01411 Encounter for gynecological examination (general) (routine) with abnormal findings: Secondary | ICD-10-CM | POA: Diagnosis not present

## 2016-07-28 DIAGNOSIS — Z309 Encounter for contraceptive management, unspecified: Secondary | ICD-10-CM | POA: Diagnosis not present

## 2016-07-28 DIAGNOSIS — N946 Dysmenorrhea, unspecified: Secondary | ICD-10-CM | POA: Diagnosis not present

## 2016-12-13 MED FILL — MELODETTA 24 FE CHEWABLE TA: 1-20 | 84 days supply | Qty: 84 | Fill #0

## 2017-02-28 MED FILL — MELODETTA 24 FE CHEWABLE TA: 1-20 | 84 days supply | Qty: 84 | Fill #1

## 2017-05-23 MED FILL — MELODETTA 24 FE CHEWABLE TA: 1-20 | 84 days supply | Qty: 84 | Fill #2

## 2017-06-18 DIAGNOSIS — Z0000 Encounter for general adult medical examination without abnormal findings: Secondary | ICD-10-CM | POA: Diagnosis not present

## 2017-08-08 MED FILL — MELODETTA 24 FE CHEWABLE TA: 1-20 | 84 days supply | Qty: 84 | Fill #0

## 2017-09-27 DIAGNOSIS — Z113 Encounter for screening for infections with a predominantly sexual mode of transmission: Secondary | ICD-10-CM | POA: Diagnosis not present

## 2017-09-27 DIAGNOSIS — Z309 Encounter for contraceptive management, unspecified: Secondary | ICD-10-CM | POA: Diagnosis not present

## 2017-09-27 DIAGNOSIS — Z01419 Encounter for gynecological examination (general) (routine) without abnormal findings: Secondary | ICD-10-CM | POA: Diagnosis not present

## 2017-11-13 MED FILL — MELODETTA 24 FE CHEWABLE TA: 1-20 | 56 days supply | Qty: 56 | Fill #1

## 2018-01-14 MED FILL — MELODETTA 24 FE CHEWABLE TA: 1-20 | 84 days supply | Qty: 84 | Fill #2

## 2018-04-02 MED FILL — MIBELAS 24 FE CHEWABLE TAB: 1-20 | 84 days supply | Qty: 84 | Fill #3

## 2018-06-26 MED FILL — MIBELAS 24 FE CHEWABLE TAB: 1-20 | 84 days supply | Qty: 84 | Fill #4

## 2018-08-15 DIAGNOSIS — Z6836 Body mass index (BMI) 36.0-36.9, adult: Secondary | ICD-10-CM | POA: Diagnosis not present

## 2018-08-15 DIAGNOSIS — Z0000 Encounter for general adult medical examination without abnormal findings: Secondary | ICD-10-CM | POA: Diagnosis not present

## 2018-09-16 MED FILL — MIBELAS 24 FE CHEWABLE TAB: 1-20 | 84 days supply | Qty: 84 | Fill #0

## 2018-12-05 MED FILL — MIBELAS 24 FE CHEWABLE TAB: 1-20 | 84 days supply | Qty: 84 | Fill #0

## 2019-01-29 ENCOUNTER — Other Ambulatory Visit (HOSPITAL_COMMUNITY)
Admission: RE | Admit: 2019-01-29 | Discharge: 2019-01-29 | Disposition: A | Discharge status: Home / Self Care | Payer: 59 | Source: Ambulatory Visit | Attending: Obstetrics and Gynecology | Admitting: Obstetrics and Gynecology

## 2019-01-29 DIAGNOSIS — Z124 Encounter for screening for malignant neoplasm of cervix: Secondary | ICD-10-CM | POA: Diagnosis not present

## 2019-01-29 DIAGNOSIS — Z01419 Encounter for gynecological examination (general) (routine) without abnormal findings: Secondary | ICD-10-CM | POA: Diagnosis not present

## 2019-01-29 DIAGNOSIS — Z3041 Encounter for surveillance of contraceptive pills: Secondary | ICD-10-CM | POA: Diagnosis not present

## 2019-01-30 ENCOUNTER — Other Ambulatory Visit: Payer: Self-pay | Admitting: Obstetrics and Gynecology

## 2019-02-05 LAB — CYTOLOGY - PAP
Diagnosis: NEGATIVE
HPV: NOT DETECTED

## 2019-03-06 MED FILL — NORETHIN ACE-ETH ESTRAD-FE: 1-20 | 84 days supply | Qty: 84 | Fill #0

## 2019-07-04 DIAGNOSIS — R1310 Dysphagia, unspecified: Secondary | ICD-10-CM | POA: Diagnosis not present

## 2019-07-04 DIAGNOSIS — G894 Chronic pain syndrome: Secondary | ICD-10-CM | POA: Diagnosis not present

## 2019-07-04 DIAGNOSIS — G8929 Other chronic pain: Secondary | ICD-10-CM | POA: Diagnosis not present

## 2019-07-04 DIAGNOSIS — K219 Gastro-esophageal reflux disease without esophagitis: Secondary | ICD-10-CM | POA: Diagnosis not present

## 2019-07-04 DIAGNOSIS — M546 Pain in thoracic spine: Secondary | ICD-10-CM | POA: Diagnosis not present

## 2019-07-04 MED FILL — PANTOPRAZOLE SOD DR 40 MG T: 40 | 90 days supply | Qty: 180 | Fill #0

## 2019-07-25 DIAGNOSIS — R1310 Dysphagia, unspecified: Secondary | ICD-10-CM | POA: Diagnosis not present

## 2019-07-25 DIAGNOSIS — M549 Dorsalgia, unspecified: Secondary | ICD-10-CM | POA: Diagnosis not present

## 2019-07-25 DIAGNOSIS — K219 Gastro-esophageal reflux disease without esophagitis: Secondary | ICD-10-CM | POA: Diagnosis not present

## 2019-08-12 ENCOUNTER — Emergency Department (HOSPITAL_BASED_OUTPATIENT_CLINIC_OR_DEPARTMENT_OTHER)
Admission: EM | Admit: 2019-08-12 | Discharge: 2019-08-12 | Disposition: A | Discharge status: Home / Self Care | Payer: 59 | Attending: Emergency Medicine | Admitting: Emergency Medicine

## 2019-08-12 ENCOUNTER — Other Ambulatory Visit: Payer: Self-pay

## 2019-08-12 ENCOUNTER — Encounter (HOSPITAL_BASED_OUTPATIENT_CLINIC_OR_DEPARTMENT_OTHER): Payer: Self-pay | Admitting: *Deleted

## 2019-08-12 DIAGNOSIS — R110 Nausea: Secondary | ICD-10-CM | POA: Diagnosis not present

## 2019-08-12 DIAGNOSIS — R1084 Generalized abdominal pain: Secondary | ICD-10-CM | POA: Insufficient documentation

## 2019-08-12 DIAGNOSIS — R197 Diarrhea, unspecified: Secondary | ICD-10-CM | POA: Diagnosis not present

## 2019-08-12 DIAGNOSIS — Z20822 Contact with and (suspected) exposure to covid-19: Secondary | ICD-10-CM | POA: Insufficient documentation

## 2019-08-12 DIAGNOSIS — M7918 Myalgia, other site: Secondary | ICD-10-CM | POA: Insufficient documentation

## 2019-08-12 DIAGNOSIS — R509 Fever, unspecified: Secondary | ICD-10-CM | POA: Diagnosis present

## 2019-08-12 DIAGNOSIS — A09 Infectious gastroenteritis and colitis, unspecified: Secondary | ICD-10-CM | POA: Diagnosis not present

## 2019-08-12 NOTE — ED Provider Notes (Signed)
[facility] EMERGENCY DEPARTMENT Provider Note   CSN: [id number] Arrival date & time: 08/12/19  2110     History Chief Complaint  Patient presents with  . Covid Exposure    Andrea Richard is a 34 y.o. female with history of migraine headaches presents today for evaluation of subjective fevers and chills, generalized body aches, diarrhea and nausea.  Reports she recently spent time with her sister whose husband tested positive for Covid and now her sister is symptomatic.  She has had a few episodes of watery nonbloody diarrhea beginning today.  Notes mild generalized cramping abdominal pain before she has diarrhea but otherwise no belly pain.  Denies vomiting, urinary symptoms, melena, hematochezia, chest pain or shortness of breath.  Has not tried anything for her symptoms.  She is a non-smoker.  Her son has similar symptoms  The history is provided by the patient.       Past Medical History:  Diagnosis Date  . Migraines     There are no problems to display for this patient.   Past Surgical History:  Procedure Laterality Date  . CESAREAN SECTION       OB History    Gravida  1   Para      Term      Preterm      AB      Living        SAB      TAB      Ectopic      Multiple      Live Births              No family history on file.  Social History   Tobacco Use  . Smoking status: Never Smoker  . Smokeless tobacco: Never Used  Substance Use Topics  . Alcohol use: No  . Drug use: No    Home Medications Prior to Admission medications   Medication Sig Start Date End Date Taking? Authorizing Provider  azithromycin (ZITHROMAX) 250 MG tablet Take as directed. Patient not taking: Reported on 05/21/2015 04/19/15   Vivi Barrack, DPM  ibuprofen (ADVIL,MOTRIN) 800 MG tablet Take 800 mg by mouth at bedtime as needed for moderate pain.     [provider]  ondansetron (ZOFRAN ODT) 8 MG disintegrating tablet Take 1 tablet (8 mg  total) by mouth every 8 (eight) hours as needed for nausea or vomiting. Patient not taking: Reported on 05/21/2015 03/22/15   Molpus, Jonny Ruiz, MD    Allergies    Patient has no known allergies.  Review of Systems   Review of Systems  Constitutional: Positive for chills. Negative for fever.  Respiratory: Negative for cough and shortness of breath.   Gastrointestinal: Positive for abdominal pain and diarrhea. Negative for vomiting.  Genitourinary: Negative for dysuria, frequency, hematuria and urgency.  Musculoskeletal: Positive for myalgias.  All other systems reviewed and are negative.   Physical Exam Updated Vital Signs BP 111/75   Pulse 76   Temp 99.4 F (37.4 C) (Oral)   Resp 18   Ht 5\' 2"  (1.575 m)   Wt 98.4 kg   LMP 07/19/2019   SpO2 100%   BMI 39.69 kg/m   Physical Exam Vitals and nursing note reviewed.  Constitutional:      General: She is not in acute distress.    Appearance: She is well-developed.     Comments: Resting comfortably in chair, overall appears well  HENT:     Head: Normocephalic and atraumatic.  Eyes:     General:        Right eye: No discharge.        Left eye: No discharge.     Conjunctiva/sclera: Conjunctivae normal.  Neck:     Vascular: No JVD.     Trachea: No tracheal deviation.  Cardiovascular:     Rate and Rhythm: Normal rate and regular rhythm.  Pulmonary:     Effort: Pulmonary effort is normal.     Breath sounds: Normal breath sounds.  Abdominal:     General: Abdomen is flat. Bowel sounds are normal. There is no distension.     Palpations: Abdomen is soft.     Tenderness: There is no abdominal tenderness. There is no guarding or rebound.  Musculoskeletal:     Cervical back: Normal range of motion and neck supple.  Skin:    General: Skin is warm and dry.     Findings: No erythema.  Neurological:     Mental Status: She is alert.  Psychiatric:        Behavior: Behavior normal.     ED Results / Procedures / Treatments    Labs (all labs ordered are listed, but only abnormal results are displayed) Labs Reviewed  NOVEL CORONAVIRUS, NAA (HOSP ORDER, SEND-OUT TO REF LAB; TAT 18-24 HRS)    EKG None  Radiology No results found.  Procedures Procedures (including critical care time)  Medications Ordered in ED Medications - No data to display  ED Course  I have reviewed the triage vital signs and the nursing notes.  Pertinent labs & imaging results that were available during my care of the patient were reviewed by me and considered in my medical decision making (see chart for details).    MDM Rules/Calculators/A&P                      Andrea Richard was evaluated in Emergency Department on 08/12/2019 for the symptoms described in the history of present illness. She was evaluated in the context of the global COVID-19 pandemic, which necessitated consideration that the patient might be at risk for infection with the SARS-CoV-2 virus that causes COVID-19. Institutional protocols and algorithms that pertain to the evaluation of patients at risk for COVID-19 are in a state of rapid change based on information released by regulatory bodies including the CDC and federal and state organizations. These policies and algorithms were followed during the patient's care in the ED.  Patient presenting for evaluation of generalized body aches, diarrhea after known Covid exposure.  She is afebrile, vital signs are stable.  She is nontoxic in appearance.  Abdomen is soft and nontender.  She appears well-hydrated.  No signs of respiratory distress, lungs are clear to auscultation bilaterally and she is satting at 100% on room air.  She does not have any risk factors/comorbidities for severe disease.  We will obtain outpatient Covid test.  Discussed symptomatic management.  Recommend quarantining at home per current CDC guidelines.  Discussed strict ED return precautions. Patient verbalized understanding of and agreement with  plan and is safe for discharge home at this time.   Final Clinical Impression(s) / ED Diagnoses Final diagnoses:  Suspected COVID-19 virus infection  Diarrhea of presumed infectious origin    Rx / DC Orders ED Discharge Orders    None       Debroah Baller 08/12/19 2302    Blanchie Dessert, MD 08/16/19 [phone]

## 2019-08-12 NOTE — ED Triage Notes (Signed)
Covid exposure 5 days ago. Today she has body aches, diarrhea and nausea.

## 2019-08-12 NOTE — Discharge Instructions (Signed)
You can take 1 to 2 tablets of Tylenol (350mg -1000mg  depending on the dose) every 6 hours as needed for pain or fever.  Do not exceed 4000 mg of Tylenol daily.  If your pain persists you can take a doses of ibuprofen in between doses of Tylenol.  I usually recommend 400 to 600 mg of ibuprofen every 6 hours.  Take this with food to avoid upset stomach issues.   Drink plenty of fluids and get plenty of rest.  Covid test will result in 24 to 48 hours.  You will receive a phone call if the test is positive.  Can also be results on MyChart.  I would recommend purchasing a device called a pulse oximeter which checks oxygen levels.  Anything below 90% is considered low and requires emergency evaluation right away.  Follow-up with primary care provider for reevaluation of symptoms.  Return to the emergency department if any concerning signs or symptoms develop such as low oxygen levels, persistent vomiting, loss of consciousness, severe chest pain, or severe abdominal pains.

## 2019-08-14 LAB — NOVEL CORONAVIRUS, NAA (HOSP ORDER, SEND-OUT TO REF LAB; TAT 18-24 HRS): SARS-CoV-2, NAA: NOT DETECTED

## 2019-08-19 DIAGNOSIS — Z20828 Contact with and (suspected) exposure to other viral communicable diseases: Secondary | ICD-10-CM | POA: Diagnosis not present

## 2019-09-09 DIAGNOSIS — K229 Disease of esophagus, unspecified: Secondary | ICD-10-CM | POA: Diagnosis not present

## 2019-09-09 DIAGNOSIS — R1314 Dysphagia, pharyngoesophageal phase: Secondary | ICD-10-CM | POA: Diagnosis not present

## 2019-09-09 DIAGNOSIS — K219 Gastro-esophageal reflux disease without esophagitis: Secondary | ICD-10-CM | POA: Diagnosis not present

## 2019-11-27 DIAGNOSIS — Z0000 Encounter for general adult medical examination without abnormal findings: Secondary | ICD-10-CM | POA: Diagnosis not present

## 2019-12-18 MED FILL — PANTOPRAZOLE SOD DR 40 MG T: 40 | 90 days supply | Qty: 180 | Fill #1

## 2020-03-12 DIAGNOSIS — M9905 Segmental and somatic dysfunction of pelvic region: Secondary | ICD-10-CM | POA: Diagnosis not present

## 2020-03-12 DIAGNOSIS — M62830 Muscle spasm of back: Secondary | ICD-10-CM | POA: Diagnosis not present

## 2020-03-12 DIAGNOSIS — M9902 Segmental and somatic dysfunction of thoracic region: Secondary | ICD-10-CM | POA: Diagnosis not present

## 2020-03-12 DIAGNOSIS — R519 Headache, unspecified: Secondary | ICD-10-CM | POA: Diagnosis not present

## 2020-03-12 DIAGNOSIS — M9903 Segmental and somatic dysfunction of lumbar region: Secondary | ICD-10-CM | POA: Diagnosis not present

## 2020-03-12 DIAGNOSIS — M5386 Other specified dorsopathies, lumbar region: Secondary | ICD-10-CM | POA: Diagnosis not present

## 2020-03-12 DIAGNOSIS — M9901 Segmental and somatic dysfunction of cervical region: Secondary | ICD-10-CM | POA: Diagnosis not present

## 2020-03-12 DIAGNOSIS — M5032 Other cervical disc degeneration, mid-cervical region, unspecified level: Secondary | ICD-10-CM | POA: Diagnosis not present

## 2020-03-15 DIAGNOSIS — M62830 Muscle spasm of back: Secondary | ICD-10-CM | POA: Diagnosis not present

## 2020-03-15 DIAGNOSIS — R519 Headache, unspecified: Secondary | ICD-10-CM | POA: Diagnosis not present

## 2020-03-15 DIAGNOSIS — M9905 Segmental and somatic dysfunction of pelvic region: Secondary | ICD-10-CM | POA: Diagnosis not present

## 2020-03-15 DIAGNOSIS — M9902 Segmental and somatic dysfunction of thoracic region: Secondary | ICD-10-CM | POA: Diagnosis not present

## 2020-03-15 DIAGNOSIS — M5032 Other cervical disc degeneration, mid-cervical region, unspecified level: Secondary | ICD-10-CM | POA: Diagnosis not present

## 2020-03-15 DIAGNOSIS — M9903 Segmental and somatic dysfunction of lumbar region: Secondary | ICD-10-CM | POA: Diagnosis not present

## 2020-03-15 DIAGNOSIS — M9901 Segmental and somatic dysfunction of cervical region: Secondary | ICD-10-CM | POA: Diagnosis not present

## 2020-03-15 DIAGNOSIS — M5386 Other specified dorsopathies, lumbar region: Secondary | ICD-10-CM | POA: Diagnosis not present

## 2020-05-11 DIAGNOSIS — L608 Other nail disorders: Secondary | ICD-10-CM | POA: Diagnosis not present

## 2020-05-11 DIAGNOSIS — M79675 Pain in left toe(s): Secondary | ICD-10-CM | POA: Diagnosis not present

## 2020-08-25 ENCOUNTER — Other Ambulatory Visit (HOSPITAL_COMMUNITY): Payer: Self-pay | Admitting: Family Medicine

## 2020-08-25 MED FILL — PANTOPRAZOLE SOD DR 40 MG T: 40 | 90 days supply | Qty: 180 | Fill #0

## 2020-11-03 DIAGNOSIS — N946 Dysmenorrhea, unspecified: Secondary | ICD-10-CM | POA: Diagnosis not present

## 2020-11-03 DIAGNOSIS — Z01419 Encounter for gynecological examination (general) (routine) without abnormal findings: Secondary | ICD-10-CM | POA: Diagnosis not present

## 2020-12-15 DIAGNOSIS — Z1329 Encounter for screening for other suspected endocrine disorder: Secondary | ICD-10-CM | POA: Diagnosis not present

## 2020-12-15 DIAGNOSIS — Z0000 Encounter for general adult medical examination without abnormal findings: Secondary | ICD-10-CM | POA: Diagnosis not present

## 2020-12-15 DIAGNOSIS — Z13220 Encounter for screening for lipoid disorders: Secondary | ICD-10-CM | POA: Diagnosis not present

## 2021-03-26 ENCOUNTER — Emergency Department (HOSPITAL_BASED_OUTPATIENT_CLINIC_OR_DEPARTMENT_OTHER): Payer: 59

## 2021-03-26 ENCOUNTER — Emergency Department (HOSPITAL_BASED_OUTPATIENT_CLINIC_OR_DEPARTMENT_OTHER)
Admission: EM | Admit: 2021-03-26 | Discharge: 2021-03-27 | Disposition: A | Discharge status: Home / Self Care | Payer: 59 | Attending: Emergency Medicine | Admitting: Emergency Medicine

## 2021-03-26 ENCOUNTER — Encounter (HOSPITAL_BASED_OUTPATIENT_CLINIC_OR_DEPARTMENT_OTHER): Payer: Self-pay | Admitting: Urology

## 2021-03-26 DIAGNOSIS — R42 Dizziness and giddiness: Secondary | ICD-10-CM | POA: Diagnosis not present

## 2021-03-26 DIAGNOSIS — R1084 Generalized abdominal pain: Secondary | ICD-10-CM | POA: Insufficient documentation

## 2021-03-26 DIAGNOSIS — M549 Dorsalgia, unspecified: Secondary | ICD-10-CM | POA: Insufficient documentation

## 2021-03-26 DIAGNOSIS — R55 Syncope and collapse: Secondary | ICD-10-CM | POA: Insufficient documentation

## 2021-03-26 DIAGNOSIS — R9431 Abnormal electrocardiogram [ECG] [EKG]: Secondary | ICD-10-CM | POA: Diagnosis not present

## 2021-03-26 DIAGNOSIS — R112 Nausea with vomiting, unspecified: Secondary | ICD-10-CM | POA: Insufficient documentation

## 2021-03-26 DIAGNOSIS — R197 Diarrhea, unspecified: Secondary | ICD-10-CM | POA: Diagnosis not present

## 2021-03-26 DIAGNOSIS — K6289 Other specified diseases of anus and rectum: Secondary | ICD-10-CM | POA: Diagnosis not present

## 2021-03-26 DIAGNOSIS — R0902 Hypoxemia: Secondary | ICD-10-CM | POA: Diagnosis not present

## 2021-03-26 DIAGNOSIS — R109 Unspecified abdominal pain: Secondary | ICD-10-CM | POA: Diagnosis not present

## 2021-03-26 DIAGNOSIS — R000 Tachycardia, unspecified: Secondary | ICD-10-CM | POA: Diagnosis not present

## 2021-03-26 DIAGNOSIS — Z20822 Contact with and (suspected) exposure to covid-19: Secondary | ICD-10-CM | POA: Diagnosis not present

## 2021-03-26 LAB — CBC WITH DIFFERENTIAL/PLATELET
Abs Immature Granulocytes: 0.02 10*3/uL (ref 0.00–0.07)
Basophils Absolute: 0 10*3/uL (ref 0.0–0.1)
Basophils Relative: 0 %
Eosinophils Absolute: 0 10*3/uL (ref 0.0–0.5)
Eosinophils Relative: 1 %
HCT: 37.2 % (ref 36.0–46.0)
Hemoglobin: 12.1 g/dL (ref 12.0–15.0)
Immature Granulocytes: 0 %
Lymphocytes Relative: 20 %
Lymphs Abs: 1.6 10*3/uL (ref 0.7–4.0)
MCH: 28.2 pg (ref 26.0–34.0)
MCHC: 32.5 g/dL (ref 30.0–36.0)
MCV: 86.7 fL (ref 80.0–100.0)
Monocytes Absolute: 0.4 10*3/uL (ref 0.1–1.0)
Monocytes Relative: 5 %
Neutro Abs: 5.9 10*3/uL (ref 1.7–7.7)
Neutrophils Relative %: 74 %
Platelets: 378 10*3/uL (ref 150–400)
RBC: 4.29 MIL/uL (ref 3.87–5.11)
RDW: 13.7 % (ref 11.5–15.5)
WBC: 8.1 10*3/uL (ref 4.0–10.5)
nRBC: 0 % (ref 0.0–0.2)

## 2021-03-26 LAB — COMPREHENSIVE METABOLIC PANEL
ALT: 15 U/L (ref 0–44)
AST: 20 U/L (ref 15–41)
Albumin: 4 g/dL (ref 3.5–5.0)
Alkaline Phosphatase: 79 U/L (ref 38–126)
Anion gap: 9 (ref 5–15)
BUN: 10 mg/dL (ref 6–20)
CO2: 24 mmol/L (ref 22–32)
Calcium: 8.9 mg/dL (ref 8.9–10.3)
Chloride: 103 mmol/L (ref 98–111)
Creatinine, Ser: 0.77 mg/dL (ref 0.44–1.00)
GFR, Estimated: >60 mL/min (ref >60)
Glucose, Bld: 101 mg/dL — ABNORMAL HIGH (ref 70–99)
Potassium: 3.4 mmol/L — ABNORMAL LOW (ref 3.5–5.1)
Sodium: 136 mmol/L (ref 135–145)
Total Bilirubin: 0.4 mg/dL (ref 0.3–1.2)
Total Protein: 8.2 g/dL — ABNORMAL HIGH (ref 6.5–8.1)

## 2021-03-26 LAB — RESP PANEL BY RT-PCR (FLU A&B, COVID) ARPGX2
Influenza A by PCR: NEGATIVE
Influenza B by PCR: NEGATIVE
SARS Coronavirus 2 by RT PCR: NEGATIVE

## 2021-03-26 LAB — PREGNANCY, URINE: Preg Test, Ur: NEGATIVE

## 2021-03-26 LAB — URINALYSIS, ROUTINE W REFLEX MICROSCOPIC
Glucose, UA: NEGATIVE mg/dL
Hgb urine dipstick: NEGATIVE
Ketones, ur: NEGATIVE mg/dL
Leukocytes,Ua: NEGATIVE
Nitrite: NEGATIVE
Protein, ur: NEGATIVE mg/dL
Specific Gravity, Urine: >1.03 — ABNORMAL HIGH (ref 1.005–1.030)
pH: 6 (ref 5.0–8.0)

## 2021-03-26 LAB — LIPASE, BLOOD: Lipase: 37 U/L (ref 11–51)

## 2021-03-26 MED ORDER — METOCLOPRAMIDE HCL 5 MG/ML IJ SOLN
10.0000 mg | Freq: Once | INTRAMUSCULAR | Status: AC
  Administered 2021-03-26: 10 mg via INTRAVENOUS
  Filled 2021-03-26: qty 2

## 2021-03-26 MED ORDER — IOHEXOL 350 MG/ML SOLN
100.0000 mL | Freq: Once | INTRAVENOUS | Status: AC | PRN
  Administered 2021-03-26: 85 mL via INTRAVENOUS

## 2021-03-26 MED ORDER — DIPHENHYDRAMINE HCL 50 MG/ML IJ SOLN
25.0000 mg | Freq: Once | INTRAMUSCULAR | Status: AC
  Administered 2021-03-26: 25 mg via INTRAVENOUS
  Filled 2021-03-26: qty 1

## 2021-03-26 MED ORDER — HALOPERIDOL LACTATE 5 MG/ML IJ SOLN
4.0000 mg | Freq: Once | INTRAMUSCULAR | Status: AC
  Administered 2021-03-27: 4 mg via INTRAVENOUS
  Filled 2021-03-26: qty 1

## 2021-03-26 MED ORDER — LACTATED RINGERS IV BOLUS
1000.0000 mL | Freq: Once | INTRAVENOUS | Status: AC
  Administered 2021-03-26: 1000 mL via INTRAVENOUS

## 2021-03-26 NOTE — ED Triage Notes (Signed)
Abd pain and bilateral flank pain x 2 weeks, N/V that started today at 1800.   BS 111 in route.  Increased urination with strong odor.  LMP 03/17/2021.  NSR 12 lead.  Syncope per family. No syncope with EMS.

## 2021-03-26 NOTE — ED Provider Notes (Signed)
[facility] EMERGENCY DEPARTMENT Provider Note   CSN: [id number] Arrival date & time: 03/26/21  2113     History Chief Complaint  Patient presents with   Abdominal Pain   Loss of Consciousness    Andrea Richard is a 35 y.o. female.  HPI     3 weeks of abdominal pain, back pain, nausea and vomiting, getting worse Has been coming and going but has been more constant over the last few days Lower abdominal cramping, left side hurting more, left sided back and abdomen Vomiting just started today, threw up 5-6 times Diarrhea, for last 3 weeks off and on, last had it a few days ago, not today.  No BM today.  Rectal pain started this week as well, pain with BM, no rectal bleeding No fevers, no dysuria, denies frequency or increased odor.  Hx of CS. Headaches, not as bad as migraine she has had.   No cough, no shortness of breath.  Chest pain on ROS comes and goes, dull pain, middle, nothing makes it better or worse.  When eating abd pain can be worse. Syncope today, was out of it for a few minutes-in and out as they were talking to her, no seizure like activity Felt lightheaded and then had syncopal episode, no hx of syncope before, no preceding chest pain, no dyspnea, no palpitations  Has not started or stopped any new medications, similar episode of n/v/ a long time ago.  No smoking, etoh or other drugs, no regular ibuprofen, will take sometimes Son has been sick this week.     Past Medical History:  Diagnosis Date   Migraines     There are no problems to display for this patient.   Past Surgical History:  Procedure Laterality Date   CESAREAN SECTION       OB History     Gravida  1   Para      Term      Preterm      AB      Living         SAB      IAB      Ectopic      Multiple      Live Births              History reviewed. No pertinent family history.  Social History   Tobacco Use   Smoking status: Never   Smokeless  tobacco: Never  Substance Use Topics   Alcohol use: No   Drug use: No    Home Medications Prior to Admission medications   Medication Sig Start Date End Date Taking? Authorizing Provider  azithromycin (ZITHROMAX) 250 MG tablet Take as directed. Patient not taking: Reported on 05/21/2015 04/19/15   Vivi Barrack, DPM  ibuprofen (ADVIL,MOTRIN) 800 MG tablet Take 800 mg by mouth at bedtime as needed for moderate pain.     [provider]  ondansetron (ZOFRAN ODT) 8 MG disintegrating tablet Take 1 tablet (8 mg total) by mouth every 8 (eight) hours as needed for nausea or vomiting. Patient not taking: Reported on 05/21/2015 03/22/15   Molpus, Jonny Ruiz, MD  pantoprazole (PROTONIX) 40 MG tablet TAKE 1 TABLET BY MOUTH TWICE DAILY WITH MEALS 08/25/20 08/25/21  Adolph Pollack, FNP    Allergies    Patient has no known allergies.  Review of Systems   Review of Systems  Constitutional:  Positive for fatigue. Negative for appetite change and fever.  HENT:  Negative  for congestion and sore throat.   Respiratory:  Negative for cough and shortness of breath.   Cardiovascular:  Positive for chest pain.  Gastrointestinal:  Positive for abdominal pain, diarrhea, nausea and vomiting.  Genitourinary:  Negative for difficulty urinating, dysuria and vaginal discharge.  Musculoskeletal:  Positive for back pain.  Neurological:  Positive for syncope and light-headedness.   Physical Exam Updated Vital Signs BP 119/81   Pulse 99   Temp 99.2 F (37.3 C) (Oral)   Resp 10   Ht 5\' 2"  (1.575 m)   Wt 93.9 kg   SpO2 100%   BMI 37.86 kg/m   Physical Exam Vitals and nursing note reviewed.  Constitutional:      General: She is not in acute distress.    Appearance: She is well-developed. She is not diaphoretic.  HENT:     Head: Normocephalic and atraumatic.  Eyes:     Conjunctiva/sclera: Conjunctivae normal.  Cardiovascular:     Rate and Rhythm: Normal rate and regular rhythm.     Heart sounds:  Normal heart sounds. No murmur heard.   No friction rub. No gallop.  Pulmonary:     Effort: Pulmonary effort is normal. No respiratory distress.     Breath sounds: Normal breath sounds. No wheezing or rales.  Abdominal:     General: There is no distension.     Palpations: Abdomen is soft.     Tenderness: There is abdominal tenderness in the left upper quadrant and left lower quadrant. There is no guarding.  Musculoskeletal:        General: No tenderness.     Cervical back: Normal range of motion.  Skin:    General: Skin is warm and dry.     Findings: No erythema or rash.  Neurological:     Mental Status: She is alert and oriented to person, place, and time.    ED Results / Procedures / Treatments   Labs (all labs ordered are listed, but only abnormal results are displayed) Labs Reviewed  URINALYSIS, ROUTINE W REFLEX MICROSCOPIC - Abnormal; Notable for the following components:      Result Value   Specific Gravity, Urine >1.030 (*)    Bilirubin Urine SMALL (*)    All other components within normal limits  COMPREHENSIVE METABOLIC PANEL - Abnormal; Notable for the following components:   Potassium 3.4 (*)    Glucose, Bld 101 (*)    Total Protein 8.2 (*)    All other components within normal limits  RESP PANEL BY RT-PCR (FLU A&B, COVID) ARPGX2  PREGNANCY, URINE  CBC WITH DIFFERENTIAL/PLATELET  LIPASE, BLOOD  TROPONIN I (HIGH SENSITIVITY)  TROPONIN I (HIGH SENSITIVITY)    EKG EKG Interpretation  Date/Time:  Saturday March 26 2021 21:53:43 EDT Ventricular Rate:  89 PR Interval:  140 QRS Duration: 80 QT Interval:  391 QTC Calculation: 476 R Axis:   39 Text Interpretation: Sinus rhythm Sinus pause Borderline prolonged QT interval No significant change since last tracing Confirmed by [id number] (Alvira Monday) on 03/26/2021 10:11:11 PM  Radiology CT ABDOMEN PELVIS W CONTRAST  Result Date: 03/26/2021 CLINICAL DATA:  Right lower quadrant abdominal pain. EXAM: CT ABDOMEN  AND PELVIS WITH CONTRAST TECHNIQUE: Multidetector CT imaging of the abdomen and pelvis was performed using the standard protocol following bolus administration of intravenous contrast. CONTRAST:  76mL OMNIPAQUE IOHEXOL 350 MG/ML SOLN COMPARISON:  None. FINDINGS: Lower chest: The visualized lung bases are clear. No intra-abdominal free air or free fluid. Hepatobiliary: Fatty liver. No  intrahepatic biliary duct dilatation. The gallbladder is unremarkable. Pancreas: Unremarkable. No pancreatic ductal dilatation or surrounding inflammatory changes. Spleen: Normal in size without focal abnormality. Adrenals/Urinary Tract: The adrenal glands unremarkable. The kidneys, visualized ureters, and urinary bladder appear unremarkable. Stomach/Bowel: There is no bowel obstruction or active inflammation. The appendix is normal. Vascular/Lymphatic: The abdominal aorta and IVC unremarkable. No portal venous gas. There is no adenopathy. Reproductive: The uterus is anteverted.  No adnexal masses. Other: Small fat containing umbilical hernia. Musculoskeletal: No acute or significant osseous findings. IMPRESSION: 1. No acute intra-abdominal or pelvic pathology. Normal appendix. 2. Fatty liver. Electronically Signed   By: Elgie Collard M.D.   On: 03/26/2021 23:38    Procedures Procedures   Medications Ordered in ED Medications  lactated ringers bolus 1,000 mL (0 mLs Intravenous Stopped 03/26/21 2245)  metoCLOPramide (REGLAN) injection 10 mg (10 mg Intravenous Given 03/26/21 2236)  diphenhydrAMINE (BENADRYL) injection 25 mg (25 mg Intravenous Given 03/26/21 2234)  iohexol (OMNIPAQUE) 350 MG/ML injection 100 mL (85 mLs Intravenous Contrast Given 03/26/21 2315)  haloperidol lactate (HALDOL) injection 4 mg (4 mg Intravenous Given 03/27/21 0009)    ED Course  I have reviewed the triage vital signs and the nursing notes.  Pertinent labs & imaging results that were available during my care of the patient were reviewed by me and  considered in my medical decision making (see chart for details).    MDM Rules/Calculators/A&P                           34yo female with history of migraines, CSection, presents with concern for abdominal pain, nausea and vomiting.  DDx includes appendicitis, pancreatitis, cholecystitis, pyelonephritis, nephrolithiasis, diverticulitis, PID, ovarian torsion, ectopic pregnancy, and tuboovarian abscess, gastroenteritis, ACS, viral syndrome. Had syncope with no preceding CP, dyspnea, no PE risk factors, doubt PE/dissection by hx and exam.  Suspect syncopal episode related to dehydration or vagal episode.  Has mild headache, no neurologic symptoms or seizure like activity, hx of migraines. No vaginal discharge, doubt pelvic etiology.  Labs show no evidence of pancreatitis, pyelonephritis, hepatitis, COVID 19/influenza.  CT abdomen pelvis without acute abnormalities.  Suspect likely viral etiology of symptoms.  Given additional nausea control and signed out with troponin and reevaluation for nausea/vomiting pending.    Final Clinical Impression(s) / ED Diagnoses Final diagnoses:  Generalized abdominal pain  Nausea and vomiting, intractability of vomiting not specified, unspecified vomiting type    Rx / DC Orders ED Discharge Orders     None        Alvira Monday, MD 03/27/21 0147

## 2021-03-27 DIAGNOSIS — R1084 Generalized abdominal pain: Secondary | ICD-10-CM | POA: Diagnosis not present

## 2021-03-27 DIAGNOSIS — R42 Dizziness and giddiness: Secondary | ICD-10-CM | POA: Diagnosis not present

## 2021-03-27 DIAGNOSIS — R112 Nausea with vomiting, unspecified: Secondary | ICD-10-CM | POA: Diagnosis not present

## 2021-03-27 DIAGNOSIS — K6289 Other specified diseases of anus and rectum: Secondary | ICD-10-CM | POA: Diagnosis not present

## 2021-03-27 DIAGNOSIS — Z20822 Contact with and (suspected) exposure to covid-19: Secondary | ICD-10-CM | POA: Diagnosis not present

## 2021-03-27 DIAGNOSIS — R55 Syncope and collapse: Secondary | ICD-10-CM | POA: Diagnosis not present

## 2021-03-27 DIAGNOSIS — M549 Dorsalgia, unspecified: Secondary | ICD-10-CM | POA: Diagnosis not present

## 2021-03-27 DIAGNOSIS — R197 Diarrhea, unspecified: Secondary | ICD-10-CM | POA: Diagnosis not present

## 2021-03-27 LAB — TROPONIN I (HIGH SENSITIVITY): Troponin I (High Sensitivity): 6 ng/L (ref <18)

## 2021-03-27 MED ORDER — ONDANSETRON 4 MG PO TBDP: 4.0000 mg | ORAL_TABLET | Freq: Three times a day (TID) | ORAL | 0 refills | Status: AC | PRN

## 2021-03-27 NOTE — ED Notes (Signed)
Tolerated po challenge; ginger ale.

## 2021-04-08 DIAGNOSIS — R197 Diarrhea, unspecified: Secondary | ICD-10-CM | POA: Diagnosis not present

## 2021-04-08 DIAGNOSIS — R1030 Lower abdominal pain, unspecified: Secondary | ICD-10-CM | POA: Diagnosis not present

## 2021-05-17 DIAGNOSIS — R197 Diarrhea, unspecified: Secondary | ICD-10-CM | POA: Diagnosis not present

## 2021-05-17 DIAGNOSIS — K648 Other hemorrhoids: Secondary | ICD-10-CM | POA: Diagnosis not present

## 2021-05-17 DIAGNOSIS — R1030 Lower abdominal pain, unspecified: Secondary | ICD-10-CM | POA: Diagnosis not present

## 2021-10-12 ENCOUNTER — Other Ambulatory Visit (HOSPITAL_BASED_OUTPATIENT_CLINIC_OR_DEPARTMENT_OTHER): Payer: Self-pay

## 2021-10-12 MED ORDER — DICYCLOMINE HCL 20 MG PO TABS
ORAL_TABLET | ORAL | 2 refills | Status: DC
  Filled 2021-10-12: qty 90, 30d supply, fill #0

## 2021-10-21 ENCOUNTER — Other Ambulatory Visit (HOSPITAL_BASED_OUTPATIENT_CLINIC_OR_DEPARTMENT_OTHER): Payer: Self-pay

## 2021-10-24 DIAGNOSIS — Z111 Encounter for screening for respiratory tuberculosis: Secondary | ICD-10-CM | POA: Diagnosis not present

## 2021-12-16 DIAGNOSIS — Z0000 Encounter for general adult medical examination without abnormal findings: Secondary | ICD-10-CM | POA: Diagnosis not present

## 2021-12-16 DIAGNOSIS — Z13220 Encounter for screening for lipoid disorders: Secondary | ICD-10-CM | POA: Diagnosis not present

## 2021-12-16 DIAGNOSIS — Z23 Encounter for immunization: Secondary | ICD-10-CM | POA: Diagnosis not present

## 2022-11-24 IMAGING — CT CT ABD-PELV W/ CM
2 of 4 series · 17 of 46 positions shown, 19 images · IV contrast (omnipaque)
Comparison: None.

CLINICAL DATA: Right lower quadrant abdominal pain.

EXAM:
CT ABDOMEN AND PELVIS WITH CONTRAST
TECHNIQUE: Multidetector CT imaging of the abdomen and pelvis was performed
using the standard protocol following bolus administration of
intravenous contrast.
CONTRAST:  85mL OMNIPAQUE IOHEXOL 350 MG/ML SOLN

[Series 2: axial st · axial · 0.98mm/px · z∈[+686,+1090]mm · 14 of 89 slices shown, 16 images]
[im 4/89  soft-tissue]
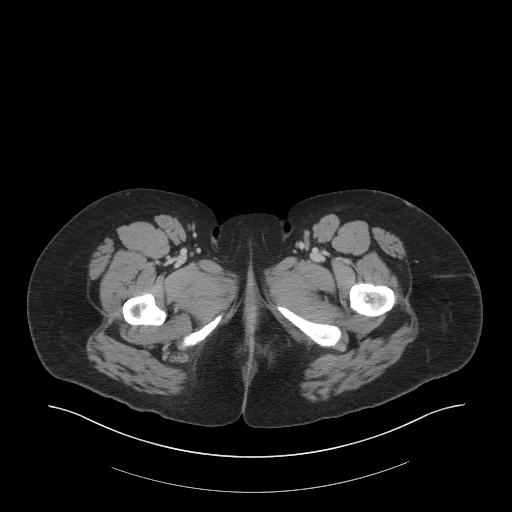
[im 4/89  bone]
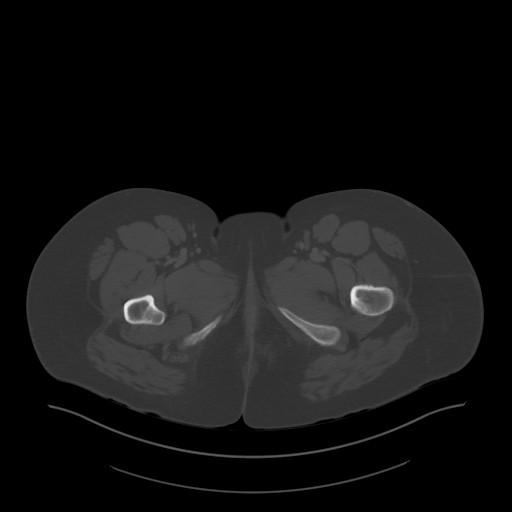
[im 11/89  soft-tissue]
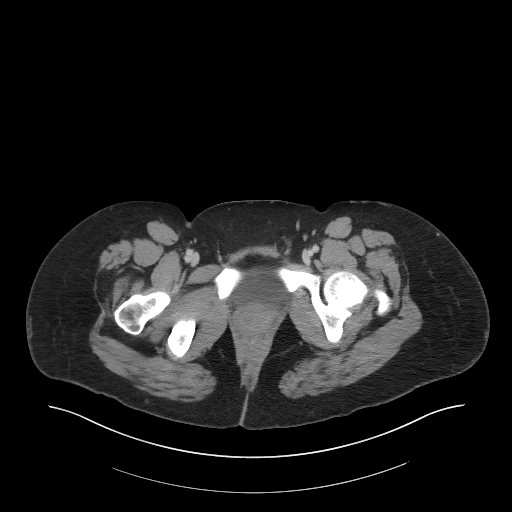
[im 18/89  soft-tissue]
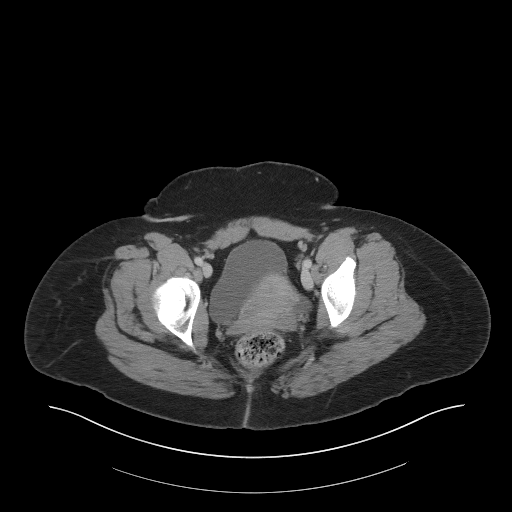
[im 25/89  soft-tissue]
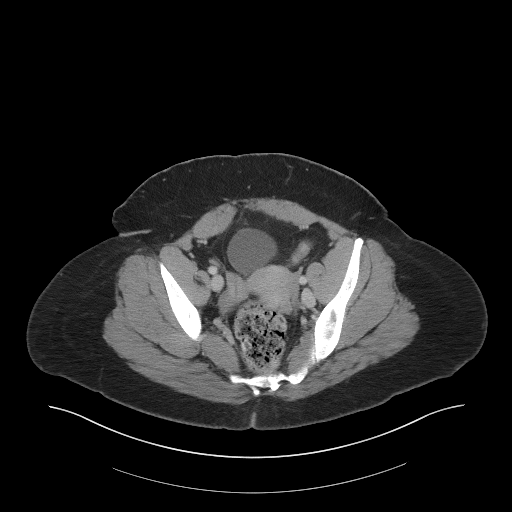
[im 29/89  soft-tissue]
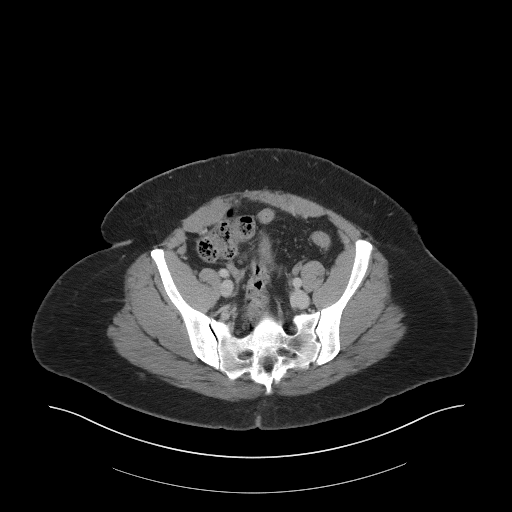
[im 36/89  soft-tissue]
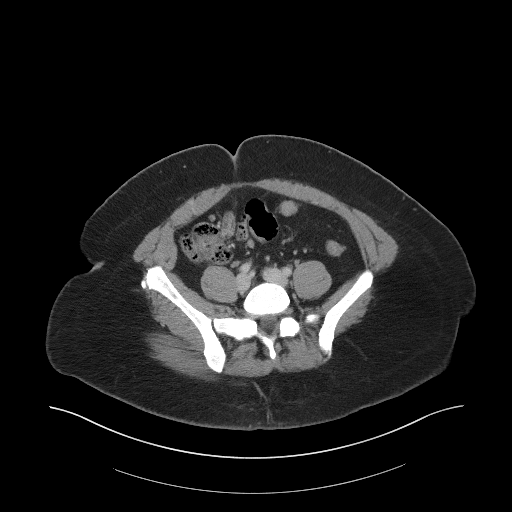
[im 43/89  soft-tissue]
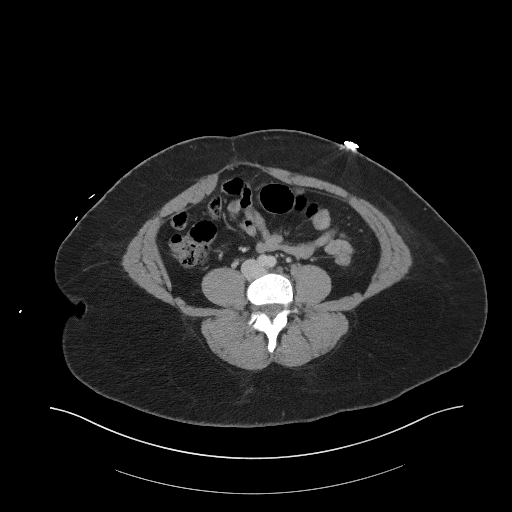
[im 46/89  soft-tissue]
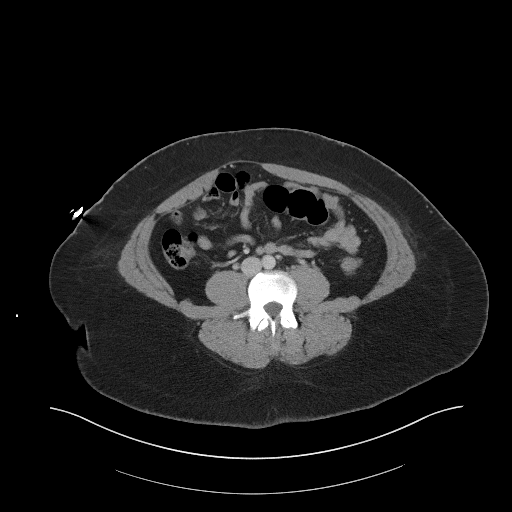
[im 53/89  soft-tissue]
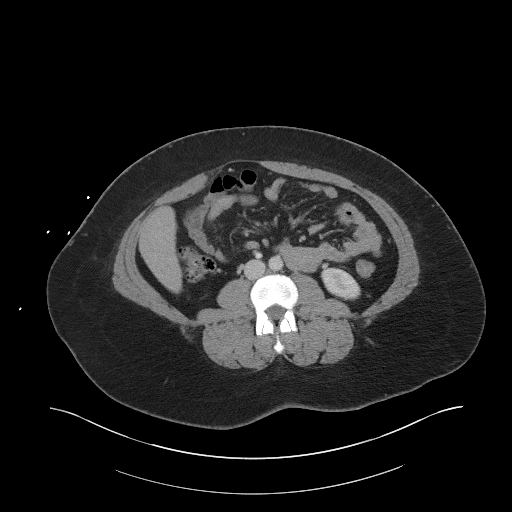
[im 53/89  bone]
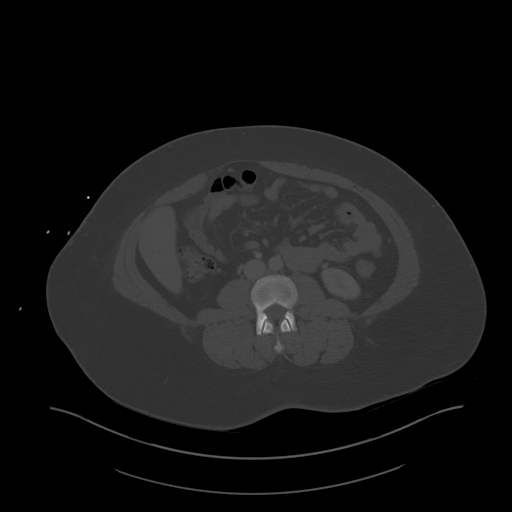
[im 60/89  soft-tissue]
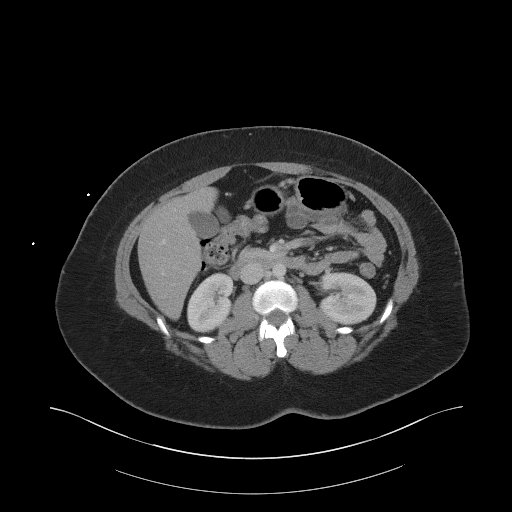
[im 67/89  soft-tissue]
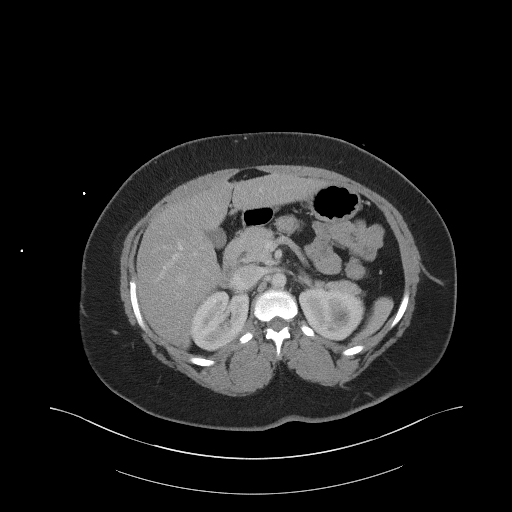
[im 71/89  soft-tissue]
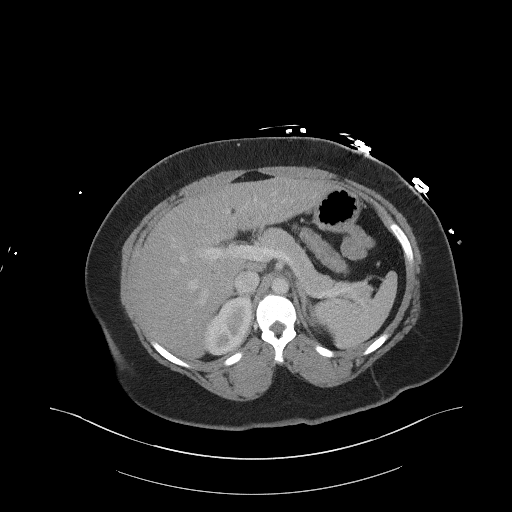
[im 78/89  soft-tissue]
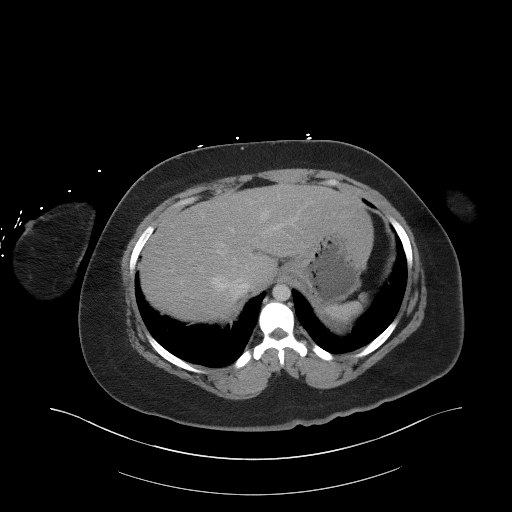
[im 85/89  soft-tissue]
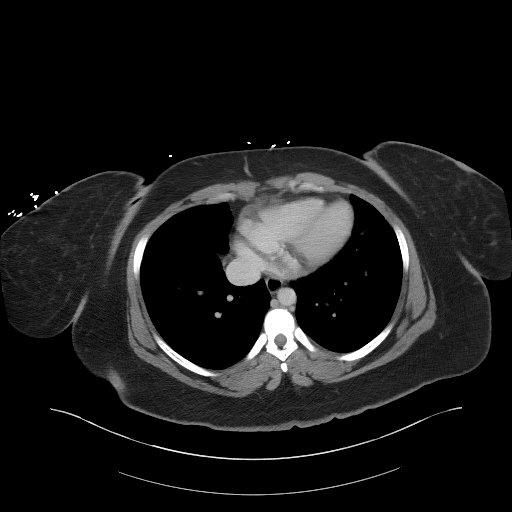

[Series 5: coronal st · coronal · 0.90mm/px · 3 of 105 slices shown]
[im 35/105  soft-tissue]
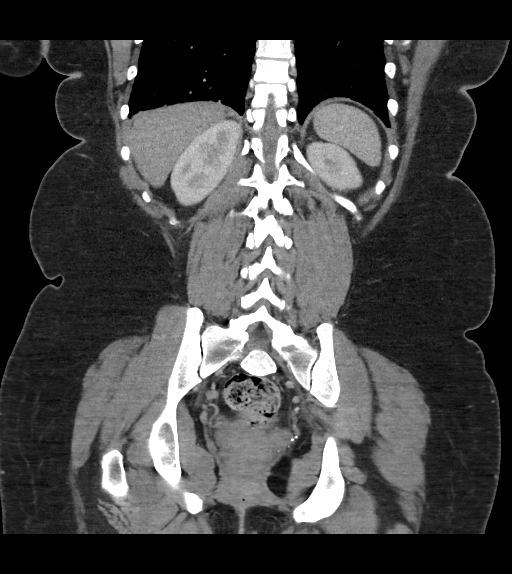
[im 47/105  soft-tissue]
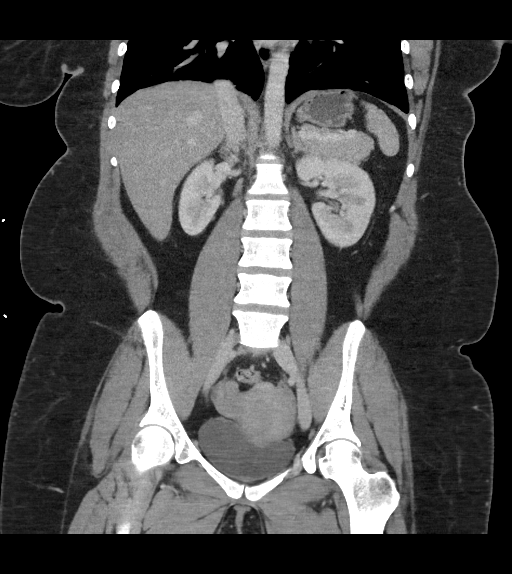
[im 58/105  soft-tissue]
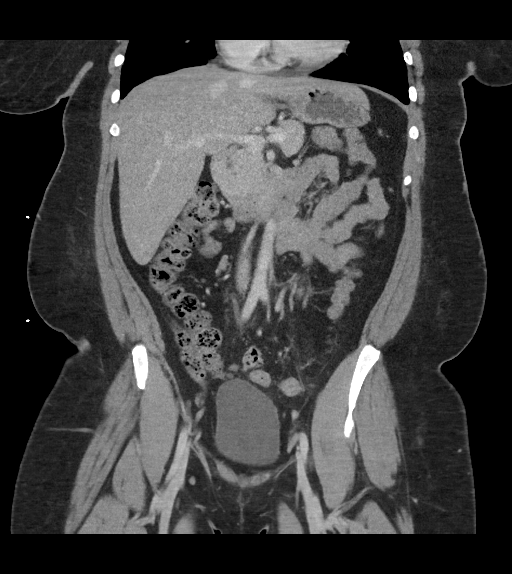

[17 of 46 positions shown; findings below may reference images not displayed]

FINDINGS: Lower chest: The visualized lung bases are clear.

No intra-abdominal free air or free fluid.

Hepatobiliary: Fatty liver. No intrahepatic biliary duct dilatation.
The gallbladder is unremarkable.

Pancreas: Unremarkable. No pancreatic ductal dilatation or
surrounding inflammatory changes.

Spleen: Normal in size without focal abnormality.

Adrenals/Urinary Tract: The adrenal glands unremarkable. The
kidneys, visualized ureters, and urinary bladder appear
unremarkable.

Stomach/Bowel: There is no bowel obstruction or active inflammation.
The appendix is normal.

Vascular/Lymphatic: The abdominal aorta and IVC unremarkable. No
portal venous gas. There is no adenopathy.

Reproductive: The uterus is anteverted.  No adnexal masses.

Other: Small fat containing umbilical hernia.

Musculoskeletal: No acute or significant osseous findings.
IMPRESSION: 1. No acute intra-abdominal or pelvic pathology. Normal appendix.
2. Fatty liver.

## 2022-12-21 DIAGNOSIS — Z0000 Encounter for general adult medical examination without abnormal findings: Secondary | ICD-10-CM | POA: Diagnosis not present

## 2022-12-21 DIAGNOSIS — Z13220 Encounter for screening for lipoid disorders: Secondary | ICD-10-CM | POA: Diagnosis not present

## 2023-05-10 DIAGNOSIS — Z23 Encounter for immunization: Secondary | ICD-10-CM | POA: Diagnosis not present

## 2023-12-28 DIAGNOSIS — Z0000 Encounter for general adult medical examination without abnormal findings: Secondary | ICD-10-CM | POA: Diagnosis not present

## 2023-12-28 DIAGNOSIS — E782 Mixed hyperlipidemia: Secondary | ICD-10-CM | POA: Diagnosis not present
